# Patient Record
Sex: Male | Born: 1940 | Race: White | Hispanic: No | Marital: Married | Smoking: Never smoker
Health system: Southern US, Community
[De-identification: ages and names within clinical notes are randomized; demographics above are authoritative.]

## PROBLEM LIST (undated history)

## (undated) DIAGNOSIS — I10 Essential (primary) hypertension: Secondary | ICD-10-CM

## (undated) DIAGNOSIS — C61 Malignant neoplasm of prostate: Secondary | ICD-10-CM

## (undated) DIAGNOSIS — F419 Anxiety disorder, unspecified: Secondary | ICD-10-CM

## (undated) DIAGNOSIS — E785 Hyperlipidemia, unspecified: Secondary | ICD-10-CM

## (undated) DIAGNOSIS — C4490 Unspecified malignant neoplasm of skin, unspecified: Secondary | ICD-10-CM

## (undated) HISTORY — PX: POLYPECTOMY: SHX149

## (undated) HISTORY — PX: COLONOSCOPY: SHX174

## (undated) HISTORY — DX: Hyperlipidemia, unspecified: E78.5

## (undated) HISTORY — PX: CARDIAC CATHETERIZATION: SHX172

---

## 1968-09-25 HISTORY — PX: FINGER SURGERY: SHX640

## 1978-09-26 DIAGNOSIS — I10 Essential (primary) hypertension: Secondary | ICD-10-CM

## 1978-09-26 HISTORY — DX: Essential (primary) hypertension: I10

## 2002-12-06 ENCOUNTER — Ambulatory Visit (HOSPITAL_COMMUNITY): Admission: RE | Admit: 2002-12-06 | Discharge: 2002-12-06 | Payer: Self-pay | Admitting: Urology

## 2009-07-21 ENCOUNTER — Encounter (INDEPENDENT_AMBULATORY_CARE_PROVIDER_SITE_OTHER): Payer: Self-pay | Admitting: *Deleted

## 2009-08-11 ENCOUNTER — Encounter (INDEPENDENT_AMBULATORY_CARE_PROVIDER_SITE_OTHER): Payer: Self-pay | Admitting: *Deleted

## 2009-08-13 ENCOUNTER — Ambulatory Visit: Payer: Self-pay | Admitting: Gastroenterology

## 2009-08-27 ENCOUNTER — Ambulatory Visit: Payer: Self-pay | Admitting: Gastroenterology

## 2009-08-29 ENCOUNTER — Encounter: Payer: Self-pay | Admitting: Gastroenterology

## 2010-02-25 DIAGNOSIS — E781 Pure hyperglyceridemia: Secondary | ICD-10-CM | POA: Insufficient documentation

## 2010-02-26 NOTE — Letter (Signed)
Summary: [facility] Instructions  [facility]  [address] [place], [place] [address]   Phone: [phone]  Fax: [phone]       Alex Mcmahon    [birth year]    MRN: [id number]        Procedure Day Alex Mcmahon:  Wednesday 08/27/2009     Arrival Time: 9:00 am      Procedure Time: 10:00 am     Location of Procedure:                    _x _  [facility] (4th Floor)                        PREPARATION FOR COLONOSCOPY WITH MOVIPREP   Starting 5 days prior to your procedure Friday 7/29 do not eat nuts, seeds, popcorn, corn, beans, peas,  salads, or any raw vegetables.  Do not take any fiber supplements (e.g. Metamucil, Citrucel, and Benefiber).  THE DAY BEFORE YOUR PROCEDURE         DATE: Tuesday 8/2  1.  Drink clear liquids the entire day-NO SOLID FOOD  2.  Do not drink anything colored red or purple.  Avoid juices with pulp.  No orange juice.  3.  Drink at least 64 oz. (8 glasses) of fluid/clear liquids during the day to prevent dehydration and help the prep work efficiently.  CLEAR LIQUIDS INCLUDE: Water Jello Ice Popsicles Tea (sugar ok, no milk/cream) Powdered fruit flavored drinks Coffee (sugar ok, no milk/cream) Gatorade Juice: apple, white grape, white cranberry  Lemonade Clear bullion, consomm, broth Carbonated beverages (any kind) Strained chicken noodle soup Hard Candy                             4.  In the morning, mix first dose of MoviPrep solution:    Empty 1 Pouch A and 1 Pouch B into the disposable container    Add lukewarm drinking water to the top line of the container. Mix to dissolve    Refrigerate (mixed solution should be used within 24 hrs)  5.  Begin drinking the prep at 5:00 p.m. The MoviPrep container is divided by 4 marks.   Every 15 minutes drink the solution down to the next mark (approximately 8 oz) until the full liter is complete.   6.  Follow completed prep with 16 oz of clear liquid of your choice  (Nothing red or purple).  Continue to drink clear liquids until bedtime.  7.  Before going to bed, mix second dose of MoviPrep solution:    Empty 1 Pouch A and 1 Pouch B into the disposable container    Add lukewarm drinking water to the top line of the container. Mix to dissolve    Refrigerate  THE DAY OF YOUR PROCEDURE      DATE:  Wednesday 8/3  Beginning at 5:00 am (5 hours before procedure):         1. Every 15 minutes, drink the solution down to the next mark (approx 8 oz) until the full liter is complete.  2. Follow completed prep with 16 oz. of clear liquid of your choice.    3. You may drink clear liquids until 8:00 am (2 HOURS BEFORE PROCEDURE).   MEDICATION INSTRUCTIONS  Unless otherwise instructed, you should take regular prescription medications with a small sip of water   as early as possible the morning  of your procedure.           OTHER INSTRUCTIONS  You will need a responsible adult at least 68 years of age to accompany you and drive you home.   This person must remain in the waiting room during your procedure.  Wear loose fitting clothing that is easily removed.  Leave jewelry and other valuables at home.  However, you may wish to bring a book to read or  an iPod/MP3 player to listen to music as you wait for your procedure to start.  Remove all body piercing jewelry and leave at home.  Total time from sign-in until discharge is approximately 2-3 hours.  You should go home directly after your procedure and rest.  You can resume normal activities the  day after your procedure.  The day of your procedure you should not:   Drive   Make legal decisions   Operate machinery   Drink alcohol   Return to work  You will receive specific instructions about eating, activities and medications before you leave.    The above instructions have been reviewed and explained to me by   Ezra Sites RN  August 13, 2009 10:33 AM     I fully understand and  can verbalize these instructions _____________________________ Date _________

## 2010-02-26 NOTE — Letter (Signed)
Summary: Patient Notice- Polyp Results  [facility]  [address] [place], [place] [address]   Phone: [phone]  Fax: [phone]        August 29, 2009 MRN: [id number]    Alex Mcmahon [address] [place], [place]  [address]    Dear Alex Mcmahon,  I am pleased to inform you that the colon polyp(s) removed during your recent colonoscopy was (were) found to be benign (no cancer detected) upon pathologic examination.  I recommend you have a repeat colonoscopy examination in 5_ years to look for recurrent polyps, as having colon polyps increases your risk for having recurrent polyps or even colon cancer in the future.  Should you develop new or worsening symptoms of abdominal pain, bowel habit changes or bleeding from the rectum or bowels, please schedule an evaluation with either your primary care physician or with me.  Additional information/recommendations:  __ No further action with gastroenterology is needed at this time. Please      follow-up with your primary care physician for your other healthcare      needs.  __ Please call [phone] to schedule a return visit to review your      situation.  __ Please keep your follow-up visit as already scheduled.  _x_ Continue treatment plan as outlined the day of your exam.  Please call us if you are having persistent problems or have questions about your condition that have not been fully answered at this time.  Sincerely,  Louis Meckel MD  This letter has been electronically signed by your physician.  Appended Document: Patient Notice- Polyp Results letter mailed

## 2010-02-26 NOTE — Letter (Signed)
Summary: Previsit letter  [facility]  [address] [place], [place] [address]   Phone: [phone]  Fax: [phone]       07/21/2009 MRN: [id number]  Alex Mcmahon [address] [place], [place]  [address]  Dear Mr. Barstow,  Welcome to the Gastroenterology Division at [facility].    You are scheduled to see a nurse for your pre-procedure visit on 08-13-09 at 10am on the 3rd floor at [facility], [address]  We ask that you try to arrive at our office 15 minutes prior to your appointment time to allow for check-in.  Your nurse visit will consist of discussing your medical and surgical history, your immediate family medical history, and your medications.    Please bring a complete list of all your medications or, if you prefer, bring the medication bottles and we will list them.  We will need to be aware of both prescribed and over the counter drugs.  We will need to know exact dosage information as well.  If you are on blood thinners (Coumadin, Plavix, Aggrenox, Ticlid, etc.) please call our office today/prior to your appointment, as we need to consult with your physician about holding your medication.   Please be prepared to read and sign documents such as consent forms, a financial agreement, and acknowledgement forms.  If necessary, and with your consent, a friend or relative is welcome to sit-in on the nurse visit with you.  Please bring your insurance card so that we may make a copy of it.  If your insurance requires a referral to see a specialist, please bring your referral form from your primary care physician.  No co-pay is required for this nurse visit.     If you cannot keep your appointment, please call [phone] to cancel or reschedule prior to your appointment date.  This allows [place] the opportunity to schedule an appointment for another patient in need of care.    Thank you for choosing [facility] for your medical needs.  We  appreciate the opportunity to care for you.  Please visit [place] at our website  to learn more about our practice.                     Sincerely.                                                                                                                   The Gastroenterology Division

## 2010-02-26 NOTE — Procedures (Signed)
Summary: Colonoscopy  Patient: Alex Mcmahon Note: All result statuses are Final unless otherwise noted.  Tests: (1) Colonoscopy (COL)   COL Colonoscopy           DONE     [facility]     [address]     [place], [place]  [address]           COLONOSCOPY PROCEDURE REPORT           PATIENT:  Alex Mcmahon, Alex Mcmahon  MR#:  [id number]     BIRTHDATE:  03-10-1940, 68 yrs. old  GENDER:  male           ENDOSCOPIST:  Barbette Hair. Arlyce Dice, MD     Referred by:           PROCEDURE DATE:  08/27/2009     PROCEDURE:  Colonoscopy with snare polypectomy, Colon with cold     biopsy polypectomy     ASA CLASS:  Class II     INDICATIONS:           MEDICATIONS:   Fentanyl 50 mcg IV, Versed 5 mg IV           DESCRIPTION OF PROCEDURE:   After the risks benefits and     alternatives of the procedure were thoroughly explained, informed     consent was obtained.  Digital rectal exam was performed and     revealed no abnormalities.   The LB CF-[id number] endoscope     was introduced through the anus and advanced to the cecum, which     was identified by both the appendix and ileocecal valve, without     limitations.  The quality of the prep was excellent, using     MoviPrep.  The instrument was then slowly withdrawn as the colon     was fully examined.     <<PROCEDUREIMAGES>>           FINDINGS:  A sessile polyp was found in the cecum. It was 3 mm in     size. Polyp was snared without cautery. Retrieval was     unsuccessful. snare polyp  A sessile polyp was found in the     ascending colon. It was 2 mm in size. The polyp was removed using     cold biopsy forceps (see image2).  A sessile polyp was found in     the descending colon. It was 3 mm in size. Polyp was snared     without cautery. Retrieval was successful (see image4). snare     polyp  This was otherwise a normal examination of the colon (see     image1, image3, image8, and image9).   Retroflexed views in the     rectum revealed no  abnormalities.    The time to cecum =  4.0     minutes. The scope was then withdrawn (time =  9.0  min) from the     patient and the procedure completed.           COMPLICATIONS:  None           ENDOSCOPIC IMPRESSION:     1) 3 mm sessile polyp in the cecum     2) 2 mm sessile polyp in the ascending colon     3) 3 mm sessile polyp in the descending colon     4) Otherwise normal examination     RECOMMENDATIONS:     1) Colonoscopy 5 years  REPEAT EXAM:  In 5 year(s) for Colonoscopy.           ______________________________     Barbette Hair. Arlyce Dice, MD           CC: Tracey Harries, MD           n.     Rosalie Doctor:   Barbette Hair. Alithia Zavaleta at 08/27/2009 10:15 AM           Einar Gip, [id number]  Note: An exclamation mark (!) indicates a result that was not dispersed into the flowsheet. Document Creation Date: 08/27/2009 10:17 AM _______________________________________________________________________  (1) Order result status: Final Collection or observation date-time: 08/27/2009 10:07 Requested date-time:  Receipt date-time:  Reported date-time:  Referring Physician:   Ordering Physician: Melvia Heaps [phone]) Specimen Source:  Source: Launa Grill Order Number: [id number] Lab site:   Appended Document: Colonoscopy     Procedures Next Due Date:    Colonoscopy: 08/2014

## 2010-02-26 NOTE — Miscellaneous (Signed)
Summary: [facility]  Clinical Lists Changes  Medications: Added new medication of MOVIPREP 100 GM  SOLR (PEG-KCL-NACL-NASULF-NA ASC-C) As per prep instructions. - Signed Rx of MOVIPREP 100 GM  SOLR (PEG-KCL-NACL-NASULF-NA ASC-C) As per prep instructions.;  #1 x 0;  Signed;  Entered by: Ezra Sites RN;  Authorized by: Louis Meckel MD;  Method used: Electronically to [id number] [address], [place], [place], [place]  [address], Ph: [phone], Fax: [phone] Observations: Added new observation of NKA: T (08/13/2009 10:15)    Prescriptions: MOVIPREP 100 GM  SOLR (PEG-KCL-NACL-NASULF-NA ASC-C) As per prep instructions.  #1 x 0   Entered by:   Ezra Sites RN   Authorized by:   Louis Meckel MD   Signed by:   Ezra Sites RN on 08/13/2009   Method used:   Electronically to        [facility] [address] [id number] (retail)       [address]       [place], [place]  [address]       Ph: [phone]       Fax: [phone]   RxID:   [id number]

## 2011-02-17 DIAGNOSIS — M159 Polyosteoarthritis, unspecified: Secondary | ICD-10-CM | POA: Insufficient documentation

## 2011-06-01 ENCOUNTER — Other Ambulatory Visit: Payer: Self-pay | Admitting: Urology

## 2011-06-01 ENCOUNTER — Encounter (HOSPITAL_COMMUNITY): Payer: Self-pay | Admitting: *Deleted

## 2011-06-01 NOTE — Progress Notes (Signed)
Pt instructed to arrive 1000 to short stay with responsible driver, blue folder, and insurance card. NPO after midnight except for BP meds with small sip of water. No aspirin, melaxacam, or ibuprofen products until after procedure. Reviewed need for laxative the day before the procedure. Pt verbalizes understanding.

## 2011-06-02 ENCOUNTER — Encounter (HOSPITAL_COMMUNITY): Payer: Self-pay | Admitting: Pharmacy Technician

## 2011-06-02 NOTE — H&P (Signed)
roblems Problems  1. Ureteral Stone Right 592.1  History of Present Illness     Mr. Alex Mcmahon is a 71 yo WM former patient of Dr. Logan Bores with a history of stones who had the onset Friday of right flank pain.  The pain was severe last night and he went to his PCP who got a CT that showed a 4.5-32mm stone in the right ureter.  He is sent in consultation for the stone by Zoe Lan FNP.  He is pain free now.  He had no gross hematuria.  He had no voiding complaints.   He had ESWL in 2004.   Past Medical History Problems  1. History of  Arthritis V13.4 2. History of  Hyperlipidemia 272.4 3. History of  Hypertension 401.9 4. History of  Nephrolithiasis V13.01  Surgical History Problems  1. History of  Lithotripsy  Current Meds 1. AmLODIPine Besylate 10 MG Oral Tablet; Therapy: 03Feb2013 to 2. Aspirin 81 MG Oral Tablet; Therapy: 06May2013 to 3. CloNIDine HCl 0.2 MG Oral Tablet; Therapy: 03Mar2013 to 4. Losartan Potassium 100 MG Oral Tablet; Therapy: 24Mar2013 to 5. Meloxicam 15 MG Oral Tablet; Therapy: 13Mar2013 to  Allergies Medication  1. No Known Drug Allergies  Family History Problems  1. Family history of  Death In The Family Father 2. Family history of  Death In The Family Mother 3. Family history of  Family Health Status Number Of Children 4. Maternal aunt's history of  Nephrolithiasis  Social History Problems    Caffeine Use   Marital History - Currently Married   Never A Smoker   Occupation: Retired Denied    History of  Alcohol Use   History of  Tobacco Use 305.1  Review of Systems Genitourinary, constitutional, skin, eye, otolaryngeal, hematologic/lymphatic, cardiovascular, pulmonary, endocrine, musculoskeletal, gastrointestinal, neurological and psychiatric system(s) were reviewed and pertinent findings if present are noted.    Vitals Vital Signs [Data Includes: Last 1 Day]  06May2013 04:24PM  BMI Calculated: 28.57 BSA Calculated: 1.94 Height: 5 ft 7  in Weight: 182 lb  Blood Pressure: 131 / 85 Temperature: 98.3 F Heart Rate: 77  Physical Exam Constitutional: Well nourished and well developed . No acute distress.  ENT:. The ears and nose are normal in appearance.  Neck: The appearance of the neck is normal and no neck mass is present.  Pulmonary: No respiratory distress and normal respiratory rhythm and effort.  Cardiovascular: Heart rate and rhythm are normal . No peripheral edema.  Abdomen: The abdomen is soft and nontender. No masses are palpated. moderate right CVA tenderness. No hernias are palpable. No hepatosplenomegaly noted.  Skin: Normal skin turgor, no visible rash and no visible skin lesions.  Neuro/Psych:. Mood and affect are appropriate.    Results/Data Urine [Data Includes: Last 1 Day]   06May2013  COLOR YELLOW   APPEARANCE CLEAR   SPECIFIC GRAVITY 1.030   pH 5.0   GLUCOSE NEG mg/dL  BILIRUBIN NEG   KETONE NEG mg/dL  BLOOD MOD   PROTEIN TRACE mg/dL  UROBILINOGEN 0.2 mg/dL  NITRITE NEG   LEUKOCYTE ESTERASE NEG   SQUAMOUS EPITHELIAL/HPF FEW   WBC 0-3 WBC/hpf  RBC 0-3 RBC/hpf  BACTERIA RARE   CRYSTALS NONE SEEN   CASTS Hyaline casts noted   Other MUCUS PRESENT    Old records or history reviewed: I have reviewed records from Dr. Bonney Leitz office including labs, office notes and x-ray reports.  The following images/tracing/specimen were independently visualized:  I have reviewed his CT which showed  a 4.8mm right proximal stone with minimal hydro. He has a large gallstone as well. KUB today.  shows his stone is readily visible in the right proximal ureter. He has degenerative changes of the lumbar spine. No other significant abnormalities are noted.  Amended By: Bjorn Pippin; 05/31/2011 5:15 PMEST   Assessment Assessed  1. Ureteral Stone Right 592.1   He has a 4.5-56mm right proximal ureteral stone and is currently having minimal symptoms.   Plan Ureteral Stone (592.1)  1. Tamsulosin HCl 0.4 MG Oral Capsule;  TAKE 1 CAPSULE Daily with meals; Therapy: 06May2013  to (Evaluate:05Jul2013); Last Rx:06May2013 2. KUB  Done: 06May2013 12:00AM 3. Follow-up Schedule Surgery Office  Follow-up  Requested for: 06May2013   I discussed the options of medical therapy, ureteroscopy and ESWL and have reviewed the risks in detail.  I have recommended ESWL if he would like active treatment since the stone is proximal.  I reviewed the risks of bleeding, infection, failure of fragmentation with need for secondary procedures, injury to the kidney or adjacent structures, thrombotic events and sedation complications.

## 2011-06-02 NOTE — Progress Notes (Signed)
Called patient at home to notify him of time change to of ESWL to 0730. Requested that patient follow all previous instructions but he is to be at [facility] Stay at [facility] 06/03/11. Pt verbalized understanding of instructions

## 2011-06-03 ENCOUNTER — Encounter (HOSPITAL_COMMUNITY): Payer: Self-pay

## 2011-06-03 ENCOUNTER — Ambulatory Visit (HOSPITAL_COMMUNITY)
Admission: RE | Admit: 2011-06-03 | Discharge: 2011-06-03 | Disposition: A | Discharge status: Home / Self Care | Payer: Medicare Other | Source: Ambulatory Visit | Attending: Urology | Admitting: Urology

## 2011-06-03 ENCOUNTER — Encounter (HOSPITAL_COMMUNITY)
Admission: RE | Disposition: A | Discharge status: Home / Self Care | Payer: Self-pay | Source: Ambulatory Visit | Attending: Urology

## 2011-06-03 ENCOUNTER — Ambulatory Visit (HOSPITAL_COMMUNITY): Payer: Medicare Other

## 2011-06-03 DIAGNOSIS — Z79899 Other long term (current) drug therapy: Secondary | ICD-10-CM | POA: Insufficient documentation

## 2011-06-03 DIAGNOSIS — Z7982 Long term (current) use of aspirin: Secondary | ICD-10-CM | POA: Insufficient documentation

## 2011-06-03 DIAGNOSIS — N201 Calculus of ureter: Secondary | ICD-10-CM | POA: Insufficient documentation

## 2011-06-03 DIAGNOSIS — E785 Hyperlipidemia, unspecified: Secondary | ICD-10-CM | POA: Insufficient documentation

## 2011-06-03 DIAGNOSIS — I10 Essential (primary) hypertension: Secondary | ICD-10-CM | POA: Insufficient documentation

## 2011-06-03 HISTORY — DX: Essential (primary) hypertension: I10

## 2011-06-03 HISTORY — DX: Anxiety disorder, unspecified: F41.9

## 2011-06-03 SURGERY — LITHOTRIPSY, ESWL
Anesthesia: LOCAL | Laterality: Right

## 2011-06-03 MED ORDER — ACETAMINOPHEN 325 MG PO TABS: 650.0000 mg | ORAL_TABLET | ORAL | Status: DC | PRN

## 2011-06-03 MED ORDER — DIAZEPAM 5 MG PO TABS
10.0000 mg | ORAL_TABLET | ORAL | Status: AC
  Administered 2011-06-03: 10 mg via ORAL

## 2011-06-03 MED ORDER — SODIUM CHLORIDE 0.9 % IJ SOLN: 3.0000 mL | Freq: Two times a day (BID) | INTRAMUSCULAR | Status: DC

## 2011-06-03 MED ORDER — CIPROFLOXACIN HCL 500 MG PO TABS
500.0000 mg | ORAL_TABLET | ORAL | Status: AC
  Administered 2011-06-03: 500 mg via ORAL

## 2011-06-03 MED ORDER — OXYCODONE-ACETAMINOPHEN 5-325 MG PO TABS: 1.0000 | ORAL_TABLET | ORAL | Status: AC | PRN

## 2011-06-03 MED ORDER — DEXTROSE-NACL 5-0.45 % IV SOLN
INTRAVENOUS | Status: DC
  Administered 2011-06-03: 07:00:00 via INTRAVENOUS

## 2011-06-03 MED ORDER — ONDANSETRON HCL 4 MG/2ML IJ SOLN: 4.0000 mg | Freq: Four times a day (QID) | INTRAMUSCULAR | Status: DC | PRN

## 2011-06-03 MED ORDER — OXYCODONE HCL 5 MG PO TABS: 5.0000 mg | ORAL_TABLET | ORAL | Status: DC | PRN

## 2011-06-03 MED ORDER — DIPHENHYDRAMINE HCL 25 MG PO CAPS
25.0000 mg | ORAL_CAPSULE | ORAL | Status: AC
  Administered 2011-06-03: 25 mg via ORAL

## 2011-06-03 MED ORDER — DIAZEPAM 5 MG PO TABS
ORAL_TABLET | ORAL | Status: AC
  Administered 2011-06-03: 10 mg via ORAL
  Filled 2011-06-03: qty 2

## 2011-06-03 MED ORDER — SODIUM CHLORIDE 0.9 % IV SOLN: 250.0000 mL | INTRAVENOUS | Status: DC | PRN

## 2011-06-03 MED ORDER — ACETAMINOPHEN 650 MG RE SUPP
650.0000 mg | RECTAL | Status: DC | PRN
  Filled 2011-06-03: qty 1

## 2011-06-03 MED ORDER — DIPHENHYDRAMINE HCL 25 MG PO CAPS
ORAL_CAPSULE | ORAL | Status: AC
  Administered 2011-06-03: 25 mg via ORAL
  Filled 2011-06-03: qty 1

## 2011-06-03 MED ORDER — FENTANYL CITRATE 0.05 MG/ML IJ SOLN: 25.0000 ug | INTRAMUSCULAR | Status: DC | PRN

## 2011-06-03 MED ORDER — CIPROFLOXACIN HCL 500 MG PO TABS
ORAL_TABLET | ORAL | Status: AC
  Administered 2011-06-03: 500 mg via ORAL
  Filled 2011-06-03: qty 1

## 2011-06-03 MED ORDER — SODIUM CHLORIDE 0.9 % IJ SOLN: 3.0000 mL | INTRAMUSCULAR | Status: DC | PRN

## 2011-06-03 NOTE — Discharge Instructions (Signed)
Lithotripsy Care After Refer to this sheet for the next few weeks. These discharge instructions provide you with general information on caring for yourself after you leave the hospital. Your caregiver may also give you specific instructions. Your treatment has been planned according to the most current medical practices available, but unavoidable complications sometimes occur. If you have any problems or questions after discharge, please call your caregiver. AFTER THE PROCEDURE   The recovery time will vary with the procedure done.   You will be taken to the recovery area. A nurse will watch and check your progress. Once you are awake, stable, and taking fluids well, you will be allowed to go home as long as there are no problems.   Your urine may have a red tinge for a few days after treatment. Blood loss is usually minimal.   You may have soreness in the back or flank area. This usually goes away after a few days. The procedure can cause blotches or bruises on the back where the pressure wave enters the skin. These marks usually cause only minimal discomfort and should disappear in a short time.   Stone fragments should begin to pass within 24 hours of treatment. However, a delayed passage is not unusual.   You may have pain, discomfort, and feel sick to your stomach (nauseous) when the crushed (pulverized) fragments of stone are passed down the tube from the kidney to the bladder. Stone fragments can pass soon after the procedure and may last for up to 4 to 8 weeks.   A small number of patients may have severe pain when stone fragments are not able to pass, which leads to an obstruction.   If your stone is greater than 1 inch/2.5 centimeters in diameter or if you have multiple stones that have a combined diameter greater than 1 inch/2.5 centimeters, you may require more than 1 treatment.   You must have someone drive you home.  HOME CARE INSTRUCTIONS   Rest at home until you feel your  energy improving.   Only take over-the-counter or prescription medicines for pain, discomfort, or fever as directed by your caregiver. Depending on the type of lithotripsy, you may need to take medicines (antibiotics) that kill germs and anti-inflammatory medicines for a few days.   Drink enough water and fluids to keep your urine clear or pale yellow. This helps "flush" your kidneys. It helps pass any remaining pieces of stone and prevents stones from coming back.   Most people can resume daily activities within 1 or 2 days after standard lithotripsy. It can take longer to recover from laser and percutaneous lithotripsy.   If the stones are in your urinary system, you may be asked to strain your urine at home to look for stones. Any stones that are found can be sent to a medical lab for examination.   Visit your caregiver for a follow-up appointment in a few weeks. Your doctor may remove your stent if you have one. Your caregiver will also check to see whether stone particles still remain.  SEEK MEDICAL CARE IF:   You have an oral temperature above 102 F (38.9 C).   Your pain is not relieved by medicine.   You have a lasting nauseous feeling.   You feel there is too much blood in the urine.   You develop persistent problems with frequent and/or painful urination that does not at least partially improve after 2 days following the procedure.   You have a congested   cough.   You feel lightheaded.   You develop a rash or any other signs that might suggest an allergic problem.   You develop any reaction or side effects to your medicine(s).  SEEK IMMEDIATE MEDICAL CARE IF:   You experience severe back and/or flank pain.   You see nothing but blood when you urinate.   You cannot pass any urine at all.   You have an oral temperature above 102 F (38.9 C), not controlled by medicine.   You develop shortness of breath, difficulty breathing, or chest pain.   You develop vomiting  that will not stop after 6 to 8 hours.   You have a fainting episode.  MAKE SURE YOU:   Understand these instructions.   Will watch your condition.   Will get help right away if you are not doing well or get worse.  Document Released: 01/31/2007 Document Revised: 12/31/2010 Document Reviewed: 01/31/2007 Exit[employer] Patient Information 2012 [employer]. 

## 2011-06-03 NOTE — Interval H&P Note (Signed)
History and Physical Interval Note:  06/03/2011 7:57 AM  Alex Mcmahon  has presented today for surgery, with the diagnosis of right proximal stone   The various methods of treatment have been discussed with the patient and family. After consideration of risks, benefits and other options for treatment, the patient has consented to  Procedure(s) (LRB): EXTRACORPOREAL SHOCK WAVE LITHOTRIPSY (ESWL) (Right) as a surgical intervention .  The patients' history has been reviewed, patient examined, no change in status, stable for surgery.  I have reviewed the patients' chart and labs.  Questions were answered to the patient's satisfaction.     Wileen Duncanson J

## 2012-03-20 DIAGNOSIS — Z0000 Encounter for general adult medical examination without abnormal findings: Secondary | ICD-10-CM | POA: Insufficient documentation

## 2013-03-21 DIAGNOSIS — I10 Essential (primary) hypertension: Secondary | ICD-10-CM | POA: Insufficient documentation

## 2013-03-21 DIAGNOSIS — E782 Mixed hyperlipidemia: Secondary | ICD-10-CM | POA: Insufficient documentation

## 2013-08-27 ENCOUNTER — Encounter: Payer: Self-pay | Admitting: Gastroenterology

## 2014-04-11 ENCOUNTER — Other Ambulatory Visit: Payer: Self-pay | Admitting: Dermatology

## 2014-08-16 ENCOUNTER — Encounter: Payer: Self-pay | Admitting: Gastroenterology

## 2014-11-11 ENCOUNTER — Encounter: Payer: Self-pay | Admitting: Gastroenterology

## 2014-12-12 ENCOUNTER — Ambulatory Visit (AMBULATORY_SURGERY_CENTER): Payer: Self-pay | Admitting: *Deleted

## 2014-12-12 VITALS — Ht 68.0 in | Wt 186.4 lb

## 2014-12-12 DIAGNOSIS — Z86010 Personal history of colonic polyps: Secondary | ICD-10-CM

## 2014-12-12 MED ORDER — NA SULFATE-K SULFATE-MG SULF 17.5-3.13-1.6 GM/177ML PO SOLN: 1.0000 | Freq: Once | ORAL | Status: DC

## 2014-12-12 NOTE — Progress Notes (Signed)
No egg or soy allergy No issues with past sedation No diet pills No home 02 use     

## 2015-01-01 ENCOUNTER — Encounter: Payer: Medicare Other | Admitting: Gastroenterology

## 2015-02-24 ENCOUNTER — Other Ambulatory Visit: Payer: Self-pay | Admitting: *Deleted

## 2015-02-27 ENCOUNTER — Ambulatory Visit (AMBULATORY_SURGERY_CENTER): Payer: Medicare Other | Admitting: Gastroenterology

## 2015-02-27 ENCOUNTER — Encounter: Payer: Self-pay | Admitting: Gastroenterology

## 2015-02-27 VITALS — BP 121/71 | HR 58 | Temp 96.3°F | Resp 21 | Ht 68.0 in | Wt 186.0 lb

## 2015-02-27 DIAGNOSIS — D128 Benign neoplasm of rectum: Secondary | ICD-10-CM | POA: Diagnosis not present

## 2015-02-27 DIAGNOSIS — D124 Benign neoplasm of descending colon: Secondary | ICD-10-CM | POA: Diagnosis not present

## 2015-02-27 DIAGNOSIS — Z86010 Personal history of colonic polyps: Secondary | ICD-10-CM

## 2015-02-27 MED ORDER — SODIUM CHLORIDE 0.9 % IV SOLN
500.0000 mL | INTRAVENOUS | Status: DC
Start: 2015-02-27 — End: 2015-02-27

## 2015-02-27 NOTE — Progress Notes (Signed)
Called to room to assist during endoscopic procedure.  Patient ID and intended procedure confirmed with present staff. Received instructions for my participation in the procedure from the performing physician.  

## 2015-02-27 NOTE — Patient Instructions (Signed)
Discharge instructions given. Handouts on polyps and diverticulosis. Resume previous medications. YOU HAD AN ENDOSCOPIC PROCEDURE TODAY AT THE [facility]:   Refer to the procedure report that was given to you for any specific questions about what was found during the examination.  If the procedure report does not answer your questions, please call your gastroenterologist to clarify.  If you requested that your care partner not be given the details of your procedure findings, then the procedure report has been included in a sealed envelope for you to review at your convenience later.  YOU SHOULD EXPECT: Some feelings of bloating in the abdomen. Passage of more gas than usual.  Walking can help get rid of the air that was put into your GI tract during the procedure and reduce the bloating. If you had a lower endoscopy (such as a colonoscopy or flexible sigmoidoscopy) you may notice spotting of blood in your stool or on the toilet paper. If you underwent a bowel prep for your procedure, you may not have a normal bowel movement for a few days.  Please Note:  You might notice some irritation and congestion in your nose or some drainage.  This is from the oxygen used during your procedure.  There is no need for concern and it should clear up in a day or so.  SYMPTOMS TO REPORT IMMEDIATELY:   Following lower endoscopy (colonoscopy or flexible sigmoidoscopy):  Excessive amounts of blood in the stool  Significant tenderness or worsening of abdominal pains  Swelling of the abdomen that is new, acute  Fever of 100F or higher   For urgent or emergent issues, a gastroenterologist can be reached at any hour by calling [phone].   DIET: Your first meal following the procedure should be a small meal and then it is ok to progress to your normal diet. Heavy or fried foods are harder to digest and may make you feel nauseous or bloated.  Likewise, meals heavy in dairy and vegetables can  increase bloating.  Drink plenty of fluids but you should avoid alcoholic beverages for 24 hours.  ACTIVITY:  You should plan to take it easy for the rest of today and you should NOT DRIVE or use heavy machinery until tomorrow (because of the sedation medicines used during the test).    FOLLOW UP: Our staff will call the number listed on your records the next business day following your procedure to check on you and address any questions or concerns that you may have regarding the information given to you following your procedure. If we do not reach you, we will leave a message.  However, if you are feeling well and you are not experiencing any problems, there is no need to return our call.  We will assume that you have returned to your regular daily activities without incident.  If any biopsies were taken you will be contacted by phone or by letter within the next 1-3 weeks.  Please call us at [phone] if you have not heard about the biopsies in 3 weeks.    SIGNATURES/CONFIDENTIALITY: You and/or your care partner have signed paperwork which will be entered into your electronic medical record.  These signatures attest to the fact that that the information above on your After Visit Summary has been reviewed and is understood.  Full responsibility of the confidentiality of this discharge information lies with you and/or your care-partner. 

## 2015-02-27 NOTE — Progress Notes (Signed)
To recovery, report to McCoy, RN, VSS 

## 2015-02-27 NOTE — Op Note (Signed)
[place]  [address] [place], [address]   COLONOSCOPY PROCEDURE REPORT  PATIENT: Alex Mcmahon, Alex Mcmahon  MR#: [id number] BIRTHDATE: [birth year] , 52  yrs. old GENDER: male ENDOSCOPIST: Harl Bowie, MD REFERRED [id number] Ronnald Ramp, NP PROCEDURE DATE:  02/27/2015 PROCEDURE:   Colonoscopy, surveillance , Colonoscopy with cold biopsy polypectomy, and Colonoscopy with snare polypectomy First Screening Colonoscopy - Avg.  risk and is 50 yrs.  old or older - No.  Prior Negative Screening - Now for repeat screening. N/A  History of Adenoma - Now for follow-up colonoscopy & has been > or = to 3 yrs.  Yes hx of adenoma.  Has been 3 or more years since last colonoscopy.  Polyps removed today? Yes ASA CLASS:   Class II INDICATIONS:Surveillance due to prior colonic neoplasia and PH Colon Adenoma. MEDICATIONS: Propofol 200 mg IV  DESCRIPTION OF PROCEDURE:   After the risks benefits and alternatives of the procedure were thoroughly explained, informed consent was obtained.  The digital rectal exam revealed no abnormalities of the rectum.   The [id number]  endoscope was introduced through the anus and advanced to the cecum, which was identified by both the appendix and ileocecal valve. No adverse events experienced.   The quality of the prep was good.  The instrument was then slowly withdrawn as the colon was fully examined. Estimated blood loss is zero unless otherwise noted in this procedure report.   COLON FINDINGS: Two sessile polyps ranging between 3-75mm in size were found in the descending colon.  A polypectomy was performed with cold forceps.  The resection was complete, the polyp tissue was completely retrieved and sent to histology.   A sessile polyp ranging between 3-51mm in size was found in the rectum.  A polypectomy was performed with a cold snare.  The resection was complete, the polyp tissue was completely retrieved and sent to histology.   There was  mild diverticulosis noted in the sigmoid colon.  Retroflexed views revealed internal hemorrhoids. The time to cecum = 4.3 Withdrawal time = 16.4   The scope was withdrawn and the procedure completed. COMPLICATIONS: There were no immediate complications.  ENDOSCOPIC IMPRESSION: 1.   Two sessile polyps ranging between 3-75mm in size were found in the descending colon; polypectomy was performed with cold forceps 2.   Sessile polyp ranging between 3-9mm in size was found in the rectum; polypectomy was performed with a cold snare 3.   There was mild diverticulosis noted in the sigmoid colon  RECOMMENDATIONS: 1.  If the polyp(s) removed today are proven to be adenomatous (pre-cancerous) polyps, you will need a repeat colonoscopy in 5 years.  Otherwise you should continue to follow colorectal cancer screening guidelines for "routine risk" patients with colonoscopy in 10 years.  You will receive a letter within 1-2 weeks with the results of your biopsy as well as final recommendations.  Please call my office if you have not received a letter after 3 weeks. 2.  Await pathology results  eSigned:  Harl Bowie, MD 02/27/2015 11:11 AM   cc:

## 2015-02-28 ENCOUNTER — Telehealth: Payer: Self-pay | Admitting: *Deleted

## 2015-02-28 NOTE — Telephone Encounter (Signed)
  Follow up Call-  Call back number 02/27/2015  Post procedure Call Back phone  # [phone]  Permission to leave phone message Yes     Patient questions:  Do you have a fever, pain , or abdominal swelling? No. Pain Score  0 *  Have you tolerated food without any problems? Yes.    Have you been able to return to your normal activities? Yes.    Do you have any questions about your discharge instructions: Diet   No. Medications  No. Follow up visit  No.  Do you have questions or concerns about your Care? No.  Actions: * If pain score is 4 or above: No action needed, pain <4.

## 2015-03-09 ENCOUNTER — Encounter: Payer: Self-pay | Admitting: Gastroenterology

## 2015-03-09 DIAGNOSIS — M5440 Lumbago with sciatica, unspecified side: Secondary | ICD-10-CM | POA: Insufficient documentation

## 2015-03-25 ENCOUNTER — Other Ambulatory Visit: Payer: Self-pay | Admitting: Orthopedic Surgery

## 2015-03-25 DIAGNOSIS — M48061 Spinal stenosis, lumbar region without neurogenic claudication: Secondary | ICD-10-CM

## 2015-04-02 ENCOUNTER — Ambulatory Visit
Admission: RE | Admit: 2015-04-02 | Discharge: 2015-04-02 | Disposition: A | Discharge status: Home / Self Care | Payer: Medicare Other | Source: Ambulatory Visit | Attending: Orthopedic Surgery | Admitting: Orthopedic Surgery

## 2015-04-02 VITALS — BP 141/74 | HR 54

## 2015-04-02 DIAGNOSIS — M48061 Spinal stenosis, lumbar region without neurogenic claudication: Secondary | ICD-10-CM

## 2015-04-02 DIAGNOSIS — M5440 Lumbago with sciatica, unspecified side: Secondary | ICD-10-CM

## 2015-04-02 MED ORDER — IOHEXOL 180 MG/ML  SOLN
15.0000 mL | Freq: Once | INTRAMUSCULAR | Status: AC | PRN
  Administered 2015-04-02: 15 mL via INTRATHECAL

## 2015-04-02 MED ORDER — ONDANSETRON HCL 4 MG/2ML IJ SOLN
4.0000 mg | Freq: Once | INTRAMUSCULAR | Status: AC
  Administered 2015-04-02: 4 mg via INTRAMUSCULAR

## 2015-04-02 MED ORDER — DIAZEPAM 5 MG PO TABS
5.0000 mg | ORAL_TABLET | Freq: Once | ORAL | Status: AC
  Administered 2015-04-02: 5 mg via ORAL

## 2015-04-02 MED ORDER — MEPERIDINE HCL 100 MG/ML IJ SOLN
75.0000 mg | Freq: Once | INTRAMUSCULAR | Status: AC
  Administered 2015-04-02: 75 mg via INTRAMUSCULAR

## 2015-04-02 NOTE — Discharge Instructions (Signed)

## 2015-05-08 DIAGNOSIS — M48061 Spinal stenosis, lumbar region without neurogenic claudication: Secondary | ICD-10-CM | POA: Insufficient documentation

## 2016-06-28 DIAGNOSIS — Z125 Encounter for screening for malignant neoplasm of prostate: Secondary | ICD-10-CM | POA: Insufficient documentation

## 2017-06-27 DIAGNOSIS — C61 Malignant neoplasm of prostate: Secondary | ICD-10-CM | POA: Insufficient documentation

## 2017-11-28 ENCOUNTER — Other Ambulatory Visit: Payer: Self-pay | Admitting: Urology

## 2017-11-28 DIAGNOSIS — C61 Malignant neoplasm of prostate: Secondary | ICD-10-CM

## 2018-02-16 ENCOUNTER — Ambulatory Visit
Admission: RE | Admit: 2018-02-16 | Discharge: 2018-02-16 | Disposition: A | Discharge status: Home / Self Care | Payer: Medicare Other | Source: Ambulatory Visit | Attending: Urology | Admitting: Urology

## 2018-02-16 DIAGNOSIS — C61 Malignant neoplasm of prostate: Secondary | ICD-10-CM

## 2018-02-16 MED ORDER — GADOBENATE DIMEGLUMINE 529 MG/ML IV SOLN
17.0000 mL | Freq: Once | INTRAVENOUS | Status: AC | PRN
  Administered 2018-02-16: 17 mL via INTRAVENOUS

## 2019-01-17 DIAGNOSIS — G8929 Other chronic pain: Secondary | ICD-10-CM | POA: Insufficient documentation

## 2019-02-20 ENCOUNTER — Other Ambulatory Visit: Payer: Self-pay

## 2019-02-20 ENCOUNTER — Ambulatory Visit: Payer: Medicare Other | Attending: Neurosurgery | Admitting: Physical Therapy

## 2019-02-20 ENCOUNTER — Encounter: Payer: Self-pay | Admitting: Physical Therapy

## 2019-02-20 DIAGNOSIS — M5442 Lumbago with sciatica, left side: Secondary | ICD-10-CM | POA: Diagnosis present

## 2019-02-20 DIAGNOSIS — M5441 Lumbago with sciatica, right side: Secondary | ICD-10-CM | POA: Insufficient documentation

## 2019-02-20 DIAGNOSIS — R262 Difficulty in walking, not elsewhere classified: Secondary | ICD-10-CM | POA: Diagnosis present

## 2019-02-20 NOTE — Therapy (Signed)
[address] [place], [place], [address] Phone: [phone]   Fax:  [phone]  Physical Therapy Evaluation  Patient Details  Name: Alex Mcmahon MRN: [id number] Date of Birth: 1941/01/15 Referring Provider (PT): Toney Reil   Encounter Date: 02/20/2019  PT End of Session - 02/20/19 0824    Visit Number  1    Number of Visits  8    Date for PT Re-Evaluation  04/20/19    PT Start Time  0750    PT Stop Time  0825    PT Time Calculation (min)  35 min    Activity Tolerance  Patient tolerated treatment well    Behavior During Therapy  [facility] for tasks assessed/performed       Past Medical History:  Diagnosis Date  . Anxiety   . Hypertension 1980's    Past Surgical History:  Procedure Laterality Date  . CARDIAC CATHETERIZATION    . COLONOSCOPY    . FINGER SURGERY  1970's  . POLYPECTOMY      There were no vitals filed for this visit.   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  He reports that it has gotten worse over the past year, reports that his feet are numb on the bottoms.  He also reports pain and numbness in the thighs, reports DDD.    Limitations  Sitting;Lifting;Standing;Walking;House hold activities    How long can you stand comfortably?  15 minutes    How long can you walk comfortably?  reports he has to use an electric cart to do any shopping    Patient Stated Goals  have better strength, walk and stand, less pain    Currently in Pain?  Yes    Pain Score  1     Pain Location  Leg    Pain Orientation  Left;Right;Upper    Pain Descriptors / Indicators  Numbness    Pain Type  Acute pain    Pain Radiating Towards  into the thighs and the feet with any standing and walking    Pain Onset  More than a month ago    Pain Frequency  Intermittent    Aggravating Factors   standing  and walking will have numbness and fatigue in the thighs he does not really report pain just the weakness    Pain Relieving Factors  sitting will alleviate after a few minutes still numbness but less fatigue and weakness    Effect of Pain on Daily Activities  limited walking, standing         [facility] PT Assessment - 02/20/19 0001      Assessment   Medical Diagnosis  neurogenic claudication of the LE's    Referring Provider (PT)  Toney Reil    Onset Date/Surgical Date  02/06/19    Prior Therapy  no      Precautions   Precautions  None      Balance Screen   Has the patient fallen in the past 6 months  No    Has the patient had a decrease in activity level because of a fear of falling?   No    Is the patient reluctant to leave their home because of a fear of falling?   No      Home Environment   Additional Comments  does some yardwork and  some hosuework, no stairs      Prior Function   Level of Independence  Independent    Vocation  Retired    Writer    Leisure  no exercise      Posture/Postural Control   Posture Comments  decreased lordosis, appears to have either a lateral shift or scoliosis, his trunk leans significantly to the right, during lumbar flexion there was no rib hump so does not appear to be scoliosis, the left hip is higher in the standing, in supine the legs appear to be = length      ROM / Strength   AROM / PROM / Strength  AROM;Strength      AROM   Overall AROM Comments  lumbar flexion WFL's, extension decreased 50%, side bending to the right WFL's, to the left decreased 50%      Strength   Overall Strength Comments  with MMT he is 4+/5 for hips, ankles and knees, but his issue is standing and walking causes the legs to fatigue quickly      Flexibility   Soft Tissue Assessment /Muscle Length  yes    Hamstrings  good    Piriformis  good      Palpation   Palpation comment  lumbar mms are tight, no tnederness, has two  finger width diastisis recti      Ambulation/Gait   Gait Comments  significant right trunk lean, no device, 200 feet max and then needs to sit and rest due to fatigue in the thighs                Objective measurements completed on examination: See above findings.              PT Education - 02/20/19 0824    Education Details  HEP for Wms flexion exercises    Person(s) Educated  Patient    Methods  Explanation;Demonstration;Handout    Comprehension  Verbalized understanding;Returned demonstration;Verbal cues required       PT Short Term Goals - 02/20/19 0834      PT SHORT TERM GOAL #1   Title  independent with initial HEP    Time  1    Period  Weeks    Status  New        PT Long Term Goals - 02/20/19 [id number]      PT LONG TERM GOAL #1   Title  understand posture and body mechanics    Time  8    Period  Weeks    Status  New      PT LONG TERM GOAL #2   Title  increase walking distance to 500 feet    Time  8    Period  Weeks    Status  New      PT LONG TERM GOAL #3   Title  increase standing time to 20 minutes    Time  8    Period  Weeks    Status  New      PT LONG TERM GOAL #4   Title  independent with advanced HEP    Time  8    Period  Weeks    Status  New             Plan - 02/20/19 0825    Clinical Impression Statement  Patient reports that over the past year he has had increased difficulty with walking and standing, limited to 200 feet of walking and 10-15  mintues of standing, does not c/o much pain but reports severe fatigue in the thighs and numbness in the feet.  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  He had DDD show up in imaging.  HE has a significant right lateral shift but no rib hump with trunk flexion.  the left hip is higher in standing but when lying down the leg lengths are equal.  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    Stability/Clinical Decision  Making  Stable/Uncomplicated    Clinical Decision Making  Low    Rehab Potential  Good    PT Frequency  1x / week    PT Duration  8 weeks    PT Treatment/Interventions  ADLs/Self Care Home Management;Electrical Stimulation;Moist Heat;Traction;Therapeutic activities;Therapeutic exercise;Neuromuscular re-education;Manual techniques;Patient/family education    PT Next Visit Plan  will try some general strength and conditioning, could try traction    Consulted and Agree with Plan of Care  Patient       Patient will benefit from skilled therapeutic intervention in order to improve the following deficits and impairments:  Abnormal gait, Pain, Improper body mechanics, Increased muscle spasms, Postural dysfunction, Decreased strength, Decreased range of motion, Decreased activity tolerance, Decreased endurance, Difficulty walking  Visit Diagnosis: Difficulty in walking, not elsewhere classified - Plan: PT plan of care cert/re-cert  Acute bilateral low back pain with bilateral sciatica - Plan: PT plan of care cert/re-cert     Problem List Patient Active Problem List   Diagnosis Date Noted  . Acute back pain with sciatica 03/09/2015  . Benign hypertension 03/21/2013  . Combined fat and carbohydrate induced hyperlipemia 03/21/2013  . Cataract 03/20/2012  . Encounter for general adult medical examination without abnormal findings 03/20/2012  . Generalized degenerative joint disease of hand 02/17/2011  . Hypertriglyceridemia 02/25/2010  . H/O renal calculi 01/04/2008  . History of colon polyps 08/23/2007    Sumner Boast., PT 02/20/2019, 8:38 AM  [address] [place], [place], [address] Phone: [phone]   Fax:  [phone]  Name: Alex Mcmahon MRN: [id number] Date of Birth: 1940/10/25

## 2019-02-27 ENCOUNTER — Ambulatory Visit: Payer: Medicare Other | Admitting: Physical Therapy

## 2019-05-21 ENCOUNTER — Ambulatory Visit (INDEPENDENT_AMBULATORY_CARE_PROVIDER_SITE_OTHER): Payer: Medicare Other | Admitting: Cardiology

## 2019-05-21 ENCOUNTER — Encounter: Payer: Self-pay | Admitting: Cardiology

## 2019-05-21 ENCOUNTER — Other Ambulatory Visit: Payer: Self-pay

## 2019-05-21 VITALS — BP 142/78 | HR 80 | Ht 69.0 in | Wt 178.0 lb

## 2019-05-21 DIAGNOSIS — I10 Essential (primary) hypertension: Secondary | ICD-10-CM

## 2019-05-21 DIAGNOSIS — R072 Precordial pain: Secondary | ICD-10-CM | POA: Diagnosis not present

## 2019-05-21 DIAGNOSIS — E7800 Pure hypercholesterolemia, unspecified: Secondary | ICD-10-CM

## 2019-05-21 MED ORDER — METOPROLOL TARTRATE 100 MG PO TABS
ORAL_TABLET | ORAL | 0 refills | Status: DC
Start: 2019-05-21 — End: 2021-06-17

## 2019-05-21 NOTE — Patient Instructions (Signed)
Medication Instructions:  NO CHANGE *If you need a refill on your cardiac medications before your next appointment, please call your pharmacy*   Lab Work: If you have labs (blood work) drawn today and your tests are completely normal, you will receive your results only by: Marland Kitchen MyChart Message (if you have MyChart) OR . A paper copy in the mail If you have any lab test that is abnormal or we need to change your treatment, we will call you to review the results.   Testing/Procedures: Your cardiac CT will be scheduled at one of the below locations:   [facility] [address] [place], [place] [address] [phone]  If scheduled at [facility], please arrive at the [facility] main entrance of [facility] 30 minutes prior to test start time. Proceed to the [facility] Radiology Department (first floor) to check-in and test prep.  Please follow these instructions carefully (unless otherwise directed):  Hold all erectile dysfunction medications at least 3 days (72 hrs) prior to test.  On the Night Before the Test: . Be sure to Drink plenty of water. . Do not consume any caffeinated/decaffeinated beverages or chocolate 12 hours prior to your test. . Do not take any antihistamines 12 hours prior to your test. . If you take Metformin do not take 24 hours prior to test.  On the Day of the Test: . Drink plenty of water. Do not drink any water within one hour of the test. . Do not eat any food 4 hours prior to the test. . You may take your regular medications prior to the test.  . Take metoprolol (Lopressor) 100 MG two hours prior to test. . HOLD Furosemide/Hydrochlorothiazide morning of the test. . FEMALES- please wear underwire-free bra if available      After the Test: . Drink plenty of water. . After receiving IV contrast, you may experience a mild flushed feeling. This is normal. . On occasion, you may experience a mild rash up to 24 hours after the  test. This is not dangerous. If this occurs, you can take Benadryl 25 mg and increase your fluid intake. . If you experience trouble breathing, this can be serious. If it is severe call 911 IMMEDIATELY. If it is mild, please call our office. . If you take any of these medications: Glipizide/Metformin, Avandament, Glucavance, please do not take 48 hours after completing test unless otherwise instructed.   Once we have confirmed authorization from your insurance company, we will call you to set up a date and time for your test.   For non-scheduling related questions, please contact the cardiac imaging nurse navigator should you have any questions/concerns: Marchia Bond, RN Navigator Cardiac Imaging [facility] [phone] office  For scheduling needs, including cancellations and rescheduling, please call [phone].      Follow-Up: At [facility], you and your health needs are our priority.  As part of our continuing mission to provide you with exceptional heart care, we have created designated Provider Care Teams.  These Care Teams include your primary Cardiologist (physician) and Advanced Practice Providers (APPs -  Physician Assistants and Nurse Practitioners) who all work together to provide you with the care you need, when you need it.  We recommend signing up for the patient portal called "MyChart".  Sign up information is provided on this After Visit Summary.  MyChart is used to connect with patients for Virtual Visits (Telemedicine).  Patients are able to  view lab/test results, encounter notes, upcoming appointments, etc.  Non-urgent messages can be sent to your provider as well.   To learn more about what you can do with MyChart, go to [email or web address].    Your next appointment:   12 month(s)  The format for your next appointment:   Either In Person or Virtual  Provider:   You may see Kirk Ruths MD or one of the following Advanced  Practice Providers on your designated Care Team:    Kerin Ransom, PA-C  [place], [place]  Coletta Memos, [place]

## 2019-05-21 NOTE — Progress Notes (Signed)
Jeanmarie Plant, NP Reason for referral-chest pain  HPI: 79 year old male for evaluation of chest pain and bradycardia at request of Eldridge Abrahams, NP.  Seen recently by primary care and noted chest pain.  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  I do not have those results available.  Note symptoms apparently occurred after eating barbecue.  Laboratories show hemoglobin 14.2, creatinine 1.47, SGOT 73, SGPT 98, bilirubin 2.2 and normal troponin.  Patient does not have dyspnea on exertion, orthopnea, PND, pedal edema, exertional chest pain or syncope.  Prior to his recent episode he had eaten barbecue.  He then developed a burning sensation in his chest without radiation.  No associated symptoms.  He states he felt he may be having a heart attack.  His symptoms lasted 2 hours and resolved.  He has had no symptoms since.  Current Outpatient Medications  Medication Sig Dispense Refill  . amLODipine (NORVASC) 10 MG tablet Take 10 mg by mouth daily with breakfast.     . aspirin 81 MG tablet Take 81 mg by mouth daily with breakfast.     . cloNIDine (CATAPRES) 0.2 MG tablet Take 0.2 mg by mouth 2 (two) times daily. Reported on 02/27/2015    . glucosamine-chondroitin 500-400 MG tablet Take 2 tablets by mouth daily.    . meloxicam (MOBIC) 15 MG tablet Take 15 mg by mouth daily with breakfast.     . metoprolol tartrate (LOPRESSOR) 25 MG tablet Take 25 mg by mouth.    . Multiple Vitamin (MULTIVITAMIN) tablet Take 1 tablet by mouth daily. One a day men's vitamin    . omega-3 acid ethyl esters (LOVAZA) 1 G capsule Take 2 g by mouth 2 (two) times daily.    Marland Kitchen losartan-hydrochlorothiazide (HYZAAR) 100-25 MG tablet Take by mouth.     No current facility-administered medications for this visit.    Allergies  Allergen Reactions  . Lisinopril Other (See Comments)    Over-sedated patient     Past Medical History:  Diagnosis Date  . Anxiety   . Hyperlipidemia    . Hypertension 1980's    Past Surgical History:  Procedure Laterality Date  . CARDIAC CATHETERIZATION    . COLONOSCOPY    . FINGER SURGERY  1970's  . POLYPECTOMY      Social History   Socioeconomic History  . Marital status: Married    Spouse name: Not on file  . Number of children: 1  . Years of education: Not on file  . Highest education level: Not on file  Occupational History  . Not on file  Tobacco Use  . Smoking status: Never Smoker  . Smokeless tobacco: Never Used  Substance and Sexual Activity  . Alcohol use: No    Alcohol/week: 0.0 standard drinks  . Drug use: No  . Sexual activity: Not on file  Other Topics Concern  . Not on file  Social History Narrative  . Not on file   Social Determinants of Health   Financial Resource Strain:   . Difficulty of Paying Living Expenses:   Food Insecurity:   . Worried About Charity fundraiser in the Last Year:   . Arboriculturist in the Last Year:   Transportation Needs:   . Film/video editor (Medical):   Marland Kitchen Lack of Transportation (Non-Medical):   Physical Activity:   . Days of Exercise per Week:   . Minutes of Exercise per Session:   Stress:   .  Feeling of Stress :   Social Connections:   . Frequency of Communication with Friends and Family:   . Frequency of Social Gatherings with Friends and Family:   . Attends Religious Services:   . Active Member of Clubs or Organizations:   . Attends Archivist Meetings:   Marland Kitchen Marital Status:   Intimate Partner Violence:   . Fear of Current or Ex-Partner:   . Emotionally Abused:   Marland Kitchen Physically Abused:   . Sexually Abused:     Family History  Problem Relation Age of Onset  . Heart disease Mother   . Heart disease Father   . Colon cancer Neg Hx   . Colon polyps Neg Hx   . Rectal cancer Neg Hx   . Stomach cancer Neg Hx     ROS: no fevers or chills, productive cough, hemoptysis, dysphasia, odynophagia, melena, hematochezia, dysuria, hematuria, rash,  seizure activity, orthopnea, PND, pedal edema, claudication. Remaining systems are negative.  Physical Exam:   Blood pressure (!) 142/78, pulse 80, height 5\' 9"  (1.753 m), weight 178 lb (80.7 kg), SpO2 98 %.  General:  Well developed/well nourished in NAD Skin warm/dry Patient not depressed No peripheral clubbing Back-normal HEENT-normal/normal eyelids Neck supple/normal carotid upstroke bilaterally; no bruits; no JVD; no thyromegaly chest - CTA/ normal expansion CV - RRR/normal S1 and S2; no murmurs, rubs or gallops;  PMI nondisplaced Abdomen -NT/ND, no right upper quadrant tenderness, no HSM, no mass, + bowel sounds, no bruit 2+ femoral pulses, no bruits Ext-no edema, chords, 2+ DP Neuro-grossly nonfocal  ECG -electrocardiogram shows sinus rhythm, left anterior fascicular block.  Personally reviewed  A/P  1 chest pain-symptoms possibly secondary to reflux.  However he has a strong family history of coronary disease, hypertension and hyperlipidemia.  I will arrange a cardiac CTA to fully assess.  2 hypertension-patient's blood pressure is controlled.  Continue present medications.  3 hyperlipidemia-Per primary care.  4 elevated liver functions-patient will need follow-up with primary care and I discussed this with him.  Patient has no right upper quadrant tenderness on examination.  Kirk Ruths, MD

## 2019-06-01 ENCOUNTER — Other Ambulatory Visit: Payer: Self-pay | Admitting: *Deleted

## 2019-06-01 DIAGNOSIS — R072 Precordial pain: Secondary | ICD-10-CM

## 2019-06-05 LAB — BASIC METABOLIC PANEL
BUN/Creatinine Ratio: 16 (ref 10–24)
BUN: 21 mg/dL (ref 8–27)
CO2: 22 mmol/L (ref 20–29)
Calcium: 9 mg/dL (ref 8.6–10.2)
Chloride: 103 mmol/L (ref 96–106)
Creatinine, Ser: 1.34 mg/dL — ABNORMAL HIGH (ref 0.76–1.27)
GFR calc Af Amer: 59 mL/min/{1.73_m2} — ABNORMAL LOW (ref >59)
GFR calc non Af Amer: 51 mL/min/{1.73_m2} — ABNORMAL LOW (ref >59)
Glucose: 94 mg/dL (ref 65–99)
Potassium: 3.7 mmol/L (ref 3.5–5.2)
Sodium: 140 mmol/L (ref 134–144)

## 2019-06-08 ENCOUNTER — Encounter: Payer: Self-pay | Admitting: *Deleted

## 2019-06-11 ENCOUNTER — Telehealth (HOSPITAL_COMMUNITY): Payer: Self-pay | Admitting: *Deleted

## 2019-06-11 NOTE — Telephone Encounter (Signed)
Reaching out to patient to offer assistance regarding upcoming cardiac imaging study. At the pt's request, pt's wife contacted.  Pt's wife verbalizes understanding of appt date/time, parking situation and where to check in, pre-test NPO status and medications ordered, and verified current allergies; name and call back number provided for further questions should they arise  Alex Saver RN Navigator Cardiac Imaging [facility] [phone] office [phone] cell

## 2019-06-12 ENCOUNTER — Encounter (HOSPITAL_COMMUNITY): Payer: Self-pay

## 2019-06-12 DIAGNOSIS — R911 Solitary pulmonary nodule: Secondary | ICD-10-CM | POA: Diagnosis not present

## 2019-06-12 DIAGNOSIS — I2510 Atherosclerotic heart disease of native coronary artery without angina pectoris: Secondary | ICD-10-CM | POA: Insufficient documentation

## 2019-06-12 DIAGNOSIS — I700 Atherosclerosis of aorta: Secondary | ICD-10-CM | POA: Diagnosis not present

## 2019-06-12 DIAGNOSIS — R072 Precordial pain: Secondary | ICD-10-CM

## 2019-06-12 MED ORDER — NITROGLYCERIN 0.4 MG SL SUBL
0.8000 mg | SUBLINGUAL_TABLET | Freq: Once | SUBLINGUAL | Status: AC
  Administered 2019-06-12: 0.8 mg via SUBLINGUAL

## 2019-06-12 MED ORDER — NITROGLYCERIN 0.4 MG SL SUBL
SUBLINGUAL_TABLET | SUBLINGUAL | Status: AC
  Filled 2019-06-12: qty 2

## 2019-06-12 MED ORDER — IOHEXOL 350 MG/ML SOLN
80.0000 mL | Freq: Once | INTRAVENOUS | Status: AC | PRN
  Administered 2019-06-12: 80 mL via INTRAVENOUS

## 2019-06-13 ENCOUNTER — Ambulatory Visit (HOSPITAL_COMMUNITY)
Admission: RE | Admit: 2019-06-13 | Discharge: 2019-06-13 | Disposition: A | Discharge status: Home / Self Care | Payer: Medicare Other | Source: Ambulatory Visit | Attending: Cardiology | Admitting: Cardiology

## 2019-06-13 DIAGNOSIS — R072 Precordial pain: Secondary | ICD-10-CM

## 2019-06-14 DIAGNOSIS — Z6826 Body mass index (BMI) 26.0-26.9, adult: Secondary | ICD-10-CM

## 2019-06-14 DIAGNOSIS — R072 Precordial pain: Secondary | ICD-10-CM | POA: Diagnosis not present

## 2019-06-14 DIAGNOSIS — I2510 Atherosclerotic heart disease of native coronary artery without angina pectoris: Secondary | ICD-10-CM | POA: Diagnosis not present

## 2019-12-03 ENCOUNTER — Encounter: Payer: Self-pay | Admitting: Radiation Oncology

## 2019-12-03 NOTE — Progress Notes (Signed)
GU Location of Tumor / Histology: prostatic adenocarcinoma  If Prostate Cancer, Gleason Score is (3 + 4) and PSA is (6.94). 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  Biopsies of prostate (if applicable) revealed:   Past/Anticipated interventions by urology, if any: prostate biopsy, active surveillance, prostate biopsy, referral to Dr. Tammi Klippel for consideration of radiation therapy  Past/Anticipated interventions by medical oncology, if any: no  Weight changes, if any: denies  Bowel/Bladder complaints, if any: IPSS 0. SHIM 1. Reports he isn't sexually active. Denies dysuria, hematuria, or urinary leakage. Denies any bowel complaints.    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  SAFETY ISSUES:  Prior radiation? denies  Pacemaker/ICD? denies  Possible current pregnancy? no, male patient  Is the patient on methotrexate? denies  Current Complaints / other details:  79 year old male. Resides in [place]. Married with 1 daughter. Retired.

## 2019-12-04 ENCOUNTER — Ambulatory Visit
Admission: RE | Admit: 2019-12-04 | Discharge: 2019-12-04 | Disposition: A | Discharge status: Home / Self Care | Payer: Medicare Other | Source: Ambulatory Visit | Attending: Radiation Oncology | Admitting: Radiation Oncology

## 2019-12-04 ENCOUNTER — Encounter: Payer: Self-pay | Admitting: Radiation Oncology

## 2019-12-04 ENCOUNTER — Other Ambulatory Visit: Payer: Self-pay

## 2019-12-04 ENCOUNTER — Encounter: Payer: Self-pay | Admitting: Medical Oncology

## 2019-12-04 VITALS — BP 155/80 | HR 79 | Temp 97.8°F | Resp 18 | Ht 68.0 in | Wt 177.6 lb

## 2019-12-04 DIAGNOSIS — I10 Essential (primary) hypertension: Secondary | ICD-10-CM | POA: Diagnosis not present

## 2019-12-04 DIAGNOSIS — F419 Anxiety disorder, unspecified: Secondary | ICD-10-CM | POA: Insufficient documentation

## 2019-12-04 DIAGNOSIS — Z7982 Long term (current) use of aspirin: Secondary | ICD-10-CM | POA: Insufficient documentation

## 2019-12-04 DIAGNOSIS — E785 Hyperlipidemia, unspecified: Secondary | ICD-10-CM | POA: Insufficient documentation

## 2019-12-04 DIAGNOSIS — Z79899 Other long term (current) drug therapy: Secondary | ICD-10-CM | POA: Diagnosis not present

## 2019-12-04 DIAGNOSIS — C61 Malignant neoplasm of prostate: Secondary | ICD-10-CM

## 2019-12-04 HISTORY — DX: Malignant neoplasm of prostate: C61

## 2019-12-04 HISTORY — DX: Unspecified malignant neoplasm of skin, unspecified: C44.90

## 2019-12-04 NOTE — Progress Notes (Signed)
Radiation Oncology         [phone] ________________________________  Initial outpatient Consultation  Name: Alex Mcmahon MRN: [id number]  Date: 12/04/2019  DOB: 04-Jul-1940  JH:ERDEY, Sherrill Raring, NP  Irine Seal, MD   REFERRING PHYSICIAN: Irine Seal, MD  DIAGNOSIS: 79 y.o. gentleman with Stage T1c adenocarcinoma of the prostate with Gleason score of 3+4, and PSA of 6.94.    ICD-10-CM   1. Cancer of prostate [facility])  C61     HISTORY OF PRESENT ILLNESS: Alex Mcmahon is a 79 y.o. male with a diagnosis of prostate cancer. He was initially diagnosed with Gleason 3+3 disease in 3/12 cores in 01/2017 by Dr. Jeffie Pollock, with a PSA of 5.05. They appropriately opted for active surveillance. His PSA dropped below 5 on subsequent readings. He underwent surveillance prostate MRI on 02/16/2018 showing no findings to strongly suggest high-grade or macroscopic carcinoma; two areas of signal abnormality within peripheral zone, consistent with PI-RADS 2 lesions; no pelvic adenopathy.  His PSA rose to 5.58 in 07/2018 and to 5.92 in 01/2019. The patient proceeded to surveillance biopsy on 05/21/2019.  The prostate volume measured 90 cc.  Out of 12 core biopsies, 4 were positive, all with 10% or less of disease in each.  The maximum Gleason score was 3+4, and this was seen in right base lateral.  Additionally, Gleason 3+3 was seen in left base, left base lateral, and left mid lateral. He opted for continued active surveillance, but more recently his PSA showed further elevation to 6.94 on 11/20/2019.  The patient reviewed the biopsy results with his urologist and he has kindly been referred today for discussion of potential radiation treatment options.   PREVIOUS RADIATION THERAPY: No  PAST MEDICAL HISTORY:  Past Medical History:  Diagnosis Date  . Anxiety   . Hyperlipidemia   . Hypertension 1980's  . Prostate cancer ([place])       PAST SURGICAL HISTORY: Past Surgical History:  Procedure Laterality  Date  . CARDIAC CATHETERIZATION    . COLONOSCOPY    . FINGER SURGERY  1970's  . POLYPECTOMY      FAMILY HISTORY:  Family History  Problem Relation Age of Onset  . Heart disease Mother   . Heart disease Father   . Colon cancer Neg Hx   . Colon polyps Neg Hx   . Rectal cancer Neg Hx   . Stomach cancer Neg Hx   . Prostate cancer Neg Hx   . Breast cancer Neg Hx     SOCIAL HISTORY:  Social History   Socioeconomic History  . Marital status: Married    Spouse name: Not on file  . Number of children: 1  . Years of education: Not on file  . Highest education level: Not on file  Occupational History  . Not on file  Tobacco Use  . Smoking status: Never Smoker  . Smokeless tobacco: Never Used  Vaping Use  . Vaping Use: Never used  Substance and Sexual Activity  . Alcohol use: No    Alcohol/week: 0.0 standard drinks  . Drug use: No  . Sexual activity: Yes  Other Topics Concern  . Not on file  Social History Narrative  . Not on file   Social Determinants of Health   Financial Resource Strain:   . Difficulty of Paying Living Expenses: Not on file  Food Insecurity:   . Worried About Charity fundraiser in the Last Year: Not on file  . Ran Out of  Food in the Last Year: Not on file  Transportation Needs:   . Lack of Transportation (Medical): Not on file  . Lack of Transportation (Non-Medical): Not on file  Physical Activity:   . Days of Exercise per Week: Not on file  . Minutes of Exercise per Session: Not on file  Stress:   . Feeling of Stress : Not on file  Social Connections:   . Frequency of Communication with Friends and Family: Not on file  . Frequency of Social Gatherings with Friends and Family: Not on file  . Attends Religious Services: Not on file  . Active Member of Clubs or Organizations: Not on file  . Attends Archivist Meetings: Not on file  . Marital Status: Not on file  Intimate Partner Violence:   . Fear of Current or Ex-Partner: Not on  file  . Emotionally Abused: Not on file  . Physically Abused: Not on file  . Sexually Abused: Not on file    ALLERGIES: Lisinopril and Docosahexaenoic acid (dha)  MEDICATIONS:  Current Outpatient Medications  Medication Sig Dispense Refill  . diclofenac Sodium (VOLTAREN) 1 % GEL Use 4 grams to affected knee every 8 hours if needed    . fluticasone (FLONASE) 50 MCG/ACT nasal spray 2 sprays by Each Nare route daily.    Marland Kitchen gabapentin (NEURONTIN) 300 MG capsule Take one at HS x one week, then one po bid    . hydrochlorothiazide (HYDRODIURIL) 25 MG tablet Take 1 tablet by mouth daily.    Marland Kitchen olmesartan (BENICAR) 40 MG tablet Take 1 tablet by mouth daily.    . rosuvastatin (CRESTOR) 5 MG tablet Take by mouth.    Marland Kitchen amLODipine (NORVASC) 10 MG tablet Take 10 mg by mouth daily with breakfast.     . aspirin 81 MG tablet Take 81 mg by mouth daily with breakfast.     . cloNIDine (CATAPRES) 0.2 MG tablet Take 0.2 mg by mouth 2 (two) times daily. Reported on 02/27/2015    . glucosamine-chondroitin 500-400 MG tablet Take 2 tablets by mouth daily.    Marland Kitchen losartan-hydrochlorothiazide (HYZAAR) 100-25 MG tablet Take by mouth.    . meloxicam (MOBIC) 15 MG tablet Take 15 mg by mouth daily with breakfast.     . metoprolol tartrate (LOPRESSOR) 100 MG tablet TAKE 2 HOURS PRIOR TO CT SCAN 1 tablet 0  . metoprolol tartrate (LOPRESSOR) 25 MG tablet Take 25 mg by mouth.    . Multiple Vitamin (MULTIVITAMIN) tablet Take 1 tablet by mouth daily. One a day men's vitamin    . omega-3 acid ethyl esters (LOVAZA) 1 G capsule Take 2 g by mouth 2 (two) times daily.     No current facility-administered medications for this encounter.    REVIEW OF SYSTEMS:  On review of systems, the patient reports that he is doing well overall. He denies any chest pain, shortness of breath, cough, fevers, chills, night sweats, unintended weight changes. He denies any bowel disturbances, and denies abdominal pain, nausea or vomiting. He denies any  new musculoskeletal or joint aches or pains. His IPSS was 0, indicating no urinary symptoms. His SHIM was 1, although he notes he isn't sexually active. A complete review of systems is obtained and is otherwise negative.    PHYSICAL EXAM:  Wt Readings from Last 3 Encounters:  05/21/19 178 lb (80.7 kg)  02/27/15 186 lb (84.4 kg)  12/12/14 186 lb 6.4 oz (84.6 kg)   Temp Readings from Last 3 Encounters:  02/27/15 (!) 96.3 F (35.7 C) (Tympanic)  06/03/11 97.8 F (36.6 C) (Oral)   BP Readings from Last 3 Encounters:  06/12/19 108/65  05/21/19 (!) 142/78  04/02/15 (P) 113/65   Pulse Readings from Last 3 Encounters:  05/21/19 80  04/02/15 (!) (P) 58  02/27/15 (!) 58    /10  In general this is a well appearing man  in no acute distress. He's alert and oriented x4 and appropriate throughout the examination. Cardiopulmonary assessment is negative for acute distress and he exhibits normal effort.    KPS = 100  100 - Normal; no complaints; no evidence of disease. 90   - Able to carry on normal activity; minor signs or symptoms of disease. 80   - Normal activity with effort; some signs or symptoms of disease. 22   - Cares for self; unable to carry on normal activity or to do active work. 60   - Requires occasional assistance, but is able to care for most of his personal needs. 50   - Requires considerable assistance and frequent medical care. 78   - Disabled; requires special care and assistance. 72   - Severely disabled; hospital admission is indicated although death not imminent. 18   - Very sick; hospital admission necessary; active supportive treatment necessary. 10   - Moribund; fatal processes progressing rapidly. 0     - Dead  Karnofsky DA, Abelmann Cannon, Craver LS and [facility] [id number]) The use of the nitrogen mustards in the palliative treatment of carcinoma: with particular reference to bronchogenic carcinoma Cancer 1 634-56  LABORATORY DATA:  No results found for: WBC,  HGB, HCT, MCV, PLT Lab Results  Component Value Date   NA 140 06/05/2019   K 3.7 06/05/2019   CL 103 06/05/2019   CO2 22 06/05/2019   No results found for: ALT, AST, GGT, ALKPHOS, BILITOT   RADIOGRAPHY: No results found.    IMPRESSION/PLAN: 1. 79 y.o. gentleman with Stage T1c adenocarcinoma of the prostate with Gleason Score of 3+4, and PSA of 6.94. We discussed the patient's workup and outlined the nature of prostate cancer in this setting. The patient's T stage, Gleason's score, and PSA put him into the favorable intermediate risk group. Accordingly, he is eligible for a variety of potential treatment options including continued active surveillance, brachytherapy, 5.5 weeks of external radiation, or prostatectomy. We discussed the available radiation techniques, and focused on the details and logistics of delivery. The patient may not be an ideal candidate for brachytherapy with a prostate volume of 90 cc. We discussed and outlined the risks, benefits, short and long-term effects associated with radiotherapy and compared and contrasted these with prostatectomy. We discussed the role of SpaceOAR gel in reducing the rectal toxicity associated with radiotherapy.   He appears to have a good understanding of his disease and our treatment recommendations which are of curative intent.  He was encouraged to ask questions that were answered to his stated satisfaction.  At the conclusion of our conversation, the patient is interested in moving forward with 5 1/2 weeks of IMRT starting in early January.  I spent 60 minutes total in the encounter.   ------------------------------------------------   Tyler Pita, MD [place] Director and Director of Stereotactic Radiosurgery Direct Dial: [phone]  Fax: [phone] [email or web address]  Skype  [email or web address]   This document serves as a record of services personally performed by Tyler Pita, MD. It was created on  his behalf by Wilburn Mylar, a  trained medical scribe. The creation of this record is based on the scribe's personal observations and the provider's statements to them. This document has been checked and approved by the attending provider.

## 2020-02-20 ENCOUNTER — Telehealth: Payer: Self-pay | Admitting: *Deleted

## 2020-02-20 NOTE — Telephone Encounter (Signed)
Called patient to inform of fid.markes and space oar placement on 03-05-20 @ [facility] and his sim on 03-11-20 - arrival time- 2:15 pm @ [facility], spoke with patient's wife- Marlowe Kays and she is aware of these appts.

## 2020-03-10 ENCOUNTER — Telehealth: Payer: Self-pay | Admitting: *Deleted

## 2020-03-10 NOTE — Telephone Encounter (Signed)
Called patient's wife- Marlowe Kays to remind of sim appt. for 03-11-20- arrival time- 2:15 pm, spoke with patient's wife- Marlowe Kays and she is aware of this appt.

## 2020-03-11 ENCOUNTER — Ambulatory Visit
Admission: RE | Admit: 2020-03-11 | Discharge: 2020-03-11 | Disposition: A | Discharge status: Home / Self Care | Payer: Medicare Other | Source: Ambulatory Visit | Attending: Radiation Oncology | Admitting: Radiation Oncology

## 2020-03-11 ENCOUNTER — Encounter: Payer: Self-pay | Admitting: Medical Oncology

## 2020-03-11 DIAGNOSIS — C61 Malignant neoplasm of prostate: Secondary | ICD-10-CM | POA: Diagnosis present

## 2020-03-11 DIAGNOSIS — Z510 Encounter for antineoplastic radiation therapy: Secondary | ICD-10-CM | POA: Diagnosis not present

## 2020-03-11 NOTE — Progress Notes (Signed)
  Radiation Oncology         [phone] ________________________________  Name: Alex Mcmahon MRN: [id number]  Date: 03/11/2020  DOB: 1940/08/25  SIMULATION AND TREATMENT PLANNING NOTE    ICD-10-CM   1. Cancer of prostate [facility])  C61     DIAGNOSIS:  80 y.o. gentleman with Stage T1c adenocarcinoma of the prostate with Gleason Score of 3+4, and PSA of 6.94.  NARRATIVE:  The patient was brought to the [facility].  Identity was confirmed.  All relevant records and images related to the planned course of therapy were reviewed.  The patient freely provided informed written consent to proceed with treatment after reviewing the details related to the planned course of therapy. The consent form was witnessed and verified by the simulation staff.  Then, the patient was set-up in a stable reproducible supine position for radiation therapy.  A vacuum lock pillow device was custom fabricated to position his legs in a reproducible immobilized position.  Then, I performed a urethrogram under sterile conditions to identify the prostatic apex.  CT images were obtained.  Surface markings were placed.  The CT images were loaded into the planning software.  Then the prostate target and avoidance structures including the rectum, bladder, bowel and hips were contoured.  Treatment planning then occurred.  The radiation prescription was entered and confirmed.  A total of one complex treatment devices was fabricated. I have requested : Intensity Modulated Radiotherapy (IMRT) is medically necessary for this case for the following reason:  Rectal sparing.Marland Kitchen  PLAN:  The patient will receive 70 Gy in 28 fractions.  ________________________________  Sheral Apley Tammi Klippel, M.D.  This document serves as a record of services personally performed by Tyler Pita, MD. It was created on his behalf by Wilburn Mylar, a trained medical scribe. The creation of this record is based on the scribe's personal  observations and the provider's statements to them. This document has been checked and approved by the attending provider.

## 2020-03-17 DIAGNOSIS — Z510 Encounter for antineoplastic radiation therapy: Secondary | ICD-10-CM | POA: Diagnosis not present

## 2020-03-20 ENCOUNTER — Other Ambulatory Visit: Payer: Self-pay

## 2020-03-20 ENCOUNTER — Ambulatory Visit
Admission: RE | Admit: 2020-03-20 | Discharge: 2020-03-20 | Disposition: A | Discharge status: Home / Self Care | Payer: Medicare Other | Source: Ambulatory Visit | Attending: Radiation Oncology | Admitting: Radiation Oncology

## 2020-03-20 DIAGNOSIS — Z510 Encounter for antineoplastic radiation therapy: Secondary | ICD-10-CM | POA: Diagnosis not present

## 2020-03-21 ENCOUNTER — Ambulatory Visit
Admission: RE | Admit: 2020-03-21 | Discharge: 2020-03-21 | Disposition: A | Discharge status: Home / Self Care | Payer: Medicare Other | Source: Ambulatory Visit | Attending: Radiation Oncology | Admitting: Radiation Oncology

## 2020-03-21 ENCOUNTER — Other Ambulatory Visit: Payer: Self-pay

## 2020-03-21 DIAGNOSIS — Z510 Encounter for antineoplastic radiation therapy: Secondary | ICD-10-CM | POA: Diagnosis not present

## 2020-03-24 ENCOUNTER — Other Ambulatory Visit: Payer: Self-pay

## 2020-03-24 ENCOUNTER — Ambulatory Visit
Admission: RE | Admit: 2020-03-24 | Discharge: 2020-03-24 | Disposition: A | Discharge status: Home / Self Care | Payer: Medicare Other | Source: Ambulatory Visit | Attending: Radiation Oncology | Admitting: Radiation Oncology

## 2020-03-24 DIAGNOSIS — Z510 Encounter for antineoplastic radiation therapy: Secondary | ICD-10-CM | POA: Diagnosis not present

## 2020-03-25 ENCOUNTER — Other Ambulatory Visit: Payer: Self-pay

## 2020-03-25 ENCOUNTER — Ambulatory Visit
Admission: RE | Admit: 2020-03-25 | Discharge: 2020-03-25 | Disposition: A | Discharge status: Home / Self Care | Payer: Medicare Other | Source: Ambulatory Visit | Attending: Radiation Oncology | Admitting: Radiation Oncology

## 2020-03-25 DIAGNOSIS — Z510 Encounter for antineoplastic radiation therapy: Secondary | ICD-10-CM | POA: Insufficient documentation

## 2020-03-25 DIAGNOSIS — C61 Malignant neoplasm of prostate: Secondary | ICD-10-CM | POA: Diagnosis present

## 2020-03-26 ENCOUNTER — Other Ambulatory Visit: Payer: Self-pay

## 2020-03-26 ENCOUNTER — Ambulatory Visit
Admission: RE | Admit: 2020-03-26 | Discharge: 2020-03-26 | Disposition: A | Discharge status: Home / Self Care | Payer: Medicare Other | Source: Ambulatory Visit | Attending: Radiation Oncology | Admitting: Radiation Oncology

## 2020-03-26 DIAGNOSIS — Z510 Encounter for antineoplastic radiation therapy: Secondary | ICD-10-CM | POA: Diagnosis not present

## 2020-03-27 ENCOUNTER — Other Ambulatory Visit: Payer: Self-pay

## 2020-03-27 ENCOUNTER — Ambulatory Visit
Admission: RE | Admit: 2020-03-27 | Discharge: 2020-03-27 | Disposition: A | Discharge status: Home / Self Care | Payer: Medicare Other | Source: Ambulatory Visit | Attending: Radiation Oncology | Admitting: Radiation Oncology

## 2020-03-27 DIAGNOSIS — Z510 Encounter for antineoplastic radiation therapy: Secondary | ICD-10-CM | POA: Diagnosis not present

## 2020-03-28 ENCOUNTER — Ambulatory Visit
Admission: RE | Admit: 2020-03-28 | Discharge: 2020-03-28 | Disposition: A | Discharge status: Home / Self Care | Payer: Medicare Other | Source: Ambulatory Visit | Attending: Radiation Oncology | Admitting: Radiation Oncology

## 2020-03-28 ENCOUNTER — Other Ambulatory Visit: Payer: Self-pay

## 2020-03-28 DIAGNOSIS — Z510 Encounter for antineoplastic radiation therapy: Secondary | ICD-10-CM | POA: Diagnosis not present

## 2020-03-31 ENCOUNTER — Ambulatory Visit
Admission: RE | Admit: 2020-03-31 | Discharge: 2020-03-31 | Disposition: A | Discharge status: Home / Self Care | Payer: Medicare Other | Source: Ambulatory Visit | Attending: Radiation Oncology | Admitting: Radiation Oncology

## 2020-03-31 ENCOUNTER — Other Ambulatory Visit: Payer: Self-pay

## 2020-03-31 DIAGNOSIS — Z510 Encounter for antineoplastic radiation therapy: Secondary | ICD-10-CM | POA: Diagnosis not present

## 2020-04-01 ENCOUNTER — Ambulatory Visit
Admission: RE | Admit: 2020-04-01 | Discharge: 2020-04-01 | Disposition: A | Discharge status: Home / Self Care | Payer: Medicare Other | Source: Ambulatory Visit | Attending: Radiation Oncology | Admitting: Radiation Oncology

## 2020-04-01 ENCOUNTER — Other Ambulatory Visit: Payer: Self-pay

## 2020-04-01 DIAGNOSIS — Z510 Encounter for antineoplastic radiation therapy: Secondary | ICD-10-CM | POA: Diagnosis not present

## 2020-04-02 ENCOUNTER — Ambulatory Visit
Admission: RE | Admit: 2020-04-02 | Discharge: 2020-04-02 | Disposition: A | Discharge status: Home / Self Care | Payer: Medicare Other | Source: Ambulatory Visit | Attending: Radiation Oncology | Admitting: Radiation Oncology

## 2020-04-02 ENCOUNTER — Other Ambulatory Visit: Payer: Self-pay

## 2020-04-02 DIAGNOSIS — Z510 Encounter for antineoplastic radiation therapy: Secondary | ICD-10-CM | POA: Diagnosis not present

## 2020-04-03 ENCOUNTER — Other Ambulatory Visit: Payer: Self-pay

## 2020-04-03 ENCOUNTER — Ambulatory Visit
Admission: RE | Admit: 2020-04-03 | Discharge: 2020-04-03 | Disposition: A | Discharge status: Home / Self Care | Payer: Medicare Other | Source: Ambulatory Visit | Attending: Radiation Oncology | Admitting: Radiation Oncology

## 2020-04-03 DIAGNOSIS — Z510 Encounter for antineoplastic radiation therapy: Secondary | ICD-10-CM | POA: Diagnosis not present

## 2020-04-04 ENCOUNTER — Other Ambulatory Visit: Payer: Self-pay

## 2020-04-04 ENCOUNTER — Ambulatory Visit
Admission: RE | Admit: 2020-04-04 | Discharge: 2020-04-04 | Disposition: A | Discharge status: Home / Self Care | Payer: Medicare Other | Source: Ambulatory Visit | Attending: Radiation Oncology | Admitting: Radiation Oncology

## 2020-04-04 DIAGNOSIS — Z510 Encounter for antineoplastic radiation therapy: Secondary | ICD-10-CM | POA: Diagnosis not present

## 2020-04-07 ENCOUNTER — Other Ambulatory Visit: Payer: Self-pay

## 2020-04-07 ENCOUNTER — Ambulatory Visit
Admission: RE | Admit: 2020-04-07 | Discharge: 2020-04-07 | Disposition: A | Discharge status: Home / Self Care | Payer: Medicare Other | Source: Ambulatory Visit | Attending: Radiation Oncology | Admitting: Radiation Oncology

## 2020-04-07 DIAGNOSIS — Z510 Encounter for antineoplastic radiation therapy: Secondary | ICD-10-CM | POA: Diagnosis not present

## 2020-04-08 ENCOUNTER — Ambulatory Visit
Admission: RE | Admit: 2020-04-08 | Discharge: 2020-04-08 | Disposition: A | Discharge status: Home / Self Care | Payer: Medicare Other | Source: Ambulatory Visit | Attending: Radiation Oncology | Admitting: Radiation Oncology

## 2020-04-08 ENCOUNTER — Other Ambulatory Visit: Payer: Self-pay

## 2020-04-08 DIAGNOSIS — Z510 Encounter for antineoplastic radiation therapy: Secondary | ICD-10-CM | POA: Diagnosis not present

## 2020-04-09 ENCOUNTER — Ambulatory Visit
Admission: RE | Admit: 2020-04-09 | Discharge: 2020-04-09 | Disposition: A | Discharge status: Home / Self Care | Payer: Medicare Other | Source: Ambulatory Visit | Attending: Radiation Oncology | Admitting: Radiation Oncology

## 2020-04-09 ENCOUNTER — Other Ambulatory Visit: Payer: Self-pay

## 2020-04-09 DIAGNOSIS — Z510 Encounter for antineoplastic radiation therapy: Secondary | ICD-10-CM | POA: Diagnosis not present

## 2020-04-10 ENCOUNTER — Ambulatory Visit
Admission: RE | Admit: 2020-04-10 | Discharge: 2020-04-10 | Disposition: A | Discharge status: Home / Self Care | Payer: Medicare Other | Source: Ambulatory Visit | Attending: Radiation Oncology | Admitting: Radiation Oncology

## 2020-04-10 DIAGNOSIS — Z510 Encounter for antineoplastic radiation therapy: Secondary | ICD-10-CM | POA: Diagnosis not present

## 2020-04-11 ENCOUNTER — Ambulatory Visit
Admission: RE | Admit: 2020-04-11 | Discharge: 2020-04-11 | Disposition: A | Discharge status: Home / Self Care | Payer: Medicare Other | Source: Ambulatory Visit | Attending: Radiation Oncology | Admitting: Radiation Oncology

## 2020-04-11 ENCOUNTER — Other Ambulatory Visit: Payer: Self-pay

## 2020-04-11 DIAGNOSIS — Z510 Encounter for antineoplastic radiation therapy: Secondary | ICD-10-CM | POA: Diagnosis not present

## 2020-04-14 ENCOUNTER — Ambulatory Visit
Admission: RE | Admit: 2020-04-14 | Discharge: 2020-04-14 | Disposition: A | Discharge status: Home / Self Care | Payer: Medicare Other | Source: Ambulatory Visit | Attending: Radiation Oncology | Admitting: Radiation Oncology

## 2020-04-14 ENCOUNTER — Other Ambulatory Visit: Payer: Self-pay

## 2020-04-14 DIAGNOSIS — Z510 Encounter for antineoplastic radiation therapy: Secondary | ICD-10-CM | POA: Diagnosis not present

## 2020-04-15 ENCOUNTER — Ambulatory Visit
Admission: RE | Admit: 2020-04-15 | Discharge: 2020-04-15 | Disposition: A | Discharge status: Home / Self Care | Payer: Medicare Other | Source: Ambulatory Visit | Attending: Radiation Oncology | Admitting: Radiation Oncology

## 2020-04-15 DIAGNOSIS — Z510 Encounter for antineoplastic radiation therapy: Secondary | ICD-10-CM | POA: Diagnosis not present

## 2020-04-16 ENCOUNTER — Ambulatory Visit
Admission: RE | Admit: 2020-04-16 | Discharge: 2020-04-16 | Disposition: A | Discharge status: Home / Self Care | Payer: Medicare Other | Source: Ambulatory Visit | Attending: Radiation Oncology | Admitting: Radiation Oncology

## 2020-04-16 ENCOUNTER — Other Ambulatory Visit: Payer: Self-pay

## 2020-04-16 DIAGNOSIS — Z510 Encounter for antineoplastic radiation therapy: Secondary | ICD-10-CM | POA: Diagnosis not present

## 2020-04-17 ENCOUNTER — Ambulatory Visit
Admission: RE | Admit: 2020-04-17 | Discharge: 2020-04-17 | Disposition: A | Discharge status: Home / Self Care | Payer: Medicare Other | Source: Ambulatory Visit | Attending: Radiation Oncology | Admitting: Radiation Oncology

## 2020-04-17 DIAGNOSIS — Z510 Encounter for antineoplastic radiation therapy: Secondary | ICD-10-CM | POA: Diagnosis not present

## 2020-04-18 ENCOUNTER — Ambulatory Visit
Admission: RE | Admit: 2020-04-18 | Discharge: 2020-04-18 | Disposition: A | Discharge status: Home / Self Care | Payer: Medicare Other | Source: Ambulatory Visit | Attending: Radiation Oncology | Admitting: Radiation Oncology

## 2020-04-18 DIAGNOSIS — Z510 Encounter for antineoplastic radiation therapy: Secondary | ICD-10-CM | POA: Diagnosis not present

## 2020-04-21 ENCOUNTER — Ambulatory Visit
Admission: RE | Admit: 2020-04-21 | Discharge: 2020-04-21 | Disposition: A | Discharge status: Home / Self Care | Payer: Medicare Other | Source: Ambulatory Visit | Attending: Radiation Oncology | Admitting: Radiation Oncology

## 2020-04-21 ENCOUNTER — Other Ambulatory Visit: Payer: Self-pay

## 2020-04-21 DIAGNOSIS — Z510 Encounter for antineoplastic radiation therapy: Secondary | ICD-10-CM | POA: Diagnosis not present

## 2020-04-22 ENCOUNTER — Ambulatory Visit
Admission: RE | Admit: 2020-04-22 | Discharge: 2020-04-22 | Disposition: A | Discharge status: Home / Self Care | Payer: Medicare Other | Source: Ambulatory Visit | Attending: Radiation Oncology | Admitting: Radiation Oncology

## 2020-04-22 DIAGNOSIS — Z510 Encounter for antineoplastic radiation therapy: Secondary | ICD-10-CM | POA: Diagnosis not present

## 2020-04-23 ENCOUNTER — Ambulatory Visit: Admission: RE | Admit: 2020-04-23 | Payer: Medicare Other | Source: Ambulatory Visit

## 2020-04-23 ENCOUNTER — Other Ambulatory Visit: Payer: Self-pay

## 2020-04-24 ENCOUNTER — Ambulatory Visit
Admission: RE | Admit: 2020-04-24 | Discharge: 2020-04-24 | Disposition: A | Discharge status: Home / Self Care | Payer: Medicare Other | Source: Ambulatory Visit | Attending: Radiation Oncology | Admitting: Radiation Oncology

## 2020-04-24 ENCOUNTER — Other Ambulatory Visit: Payer: Self-pay

## 2020-04-24 DIAGNOSIS — Z510 Encounter for antineoplastic radiation therapy: Secondary | ICD-10-CM | POA: Diagnosis not present

## 2020-04-25 ENCOUNTER — Ambulatory Visit
Admission: RE | Admit: 2020-04-25 | Discharge: 2020-04-25 | Disposition: A | Discharge status: Home / Self Care | Payer: Medicare Other | Source: Ambulatory Visit | Attending: Radiation Oncology | Admitting: Radiation Oncology

## 2020-04-25 ENCOUNTER — Other Ambulatory Visit: Payer: Self-pay

## 2020-04-25 DIAGNOSIS — Z510 Encounter for antineoplastic radiation therapy: Secondary | ICD-10-CM | POA: Insufficient documentation

## 2020-04-25 DIAGNOSIS — C61 Malignant neoplasm of prostate: Secondary | ICD-10-CM | POA: Diagnosis present

## 2020-04-28 ENCOUNTER — Ambulatory Visit: Payer: Medicare Other

## 2020-04-28 ENCOUNTER — Other Ambulatory Visit: Payer: Self-pay

## 2020-04-28 ENCOUNTER — Ambulatory Visit
Admission: RE | Admit: 2020-04-28 | Discharge: 2020-04-28 | Disposition: A | Discharge status: Home / Self Care | Payer: Medicare Other | Source: Ambulatory Visit | Attending: Radiation Oncology | Admitting: Radiation Oncology

## 2020-04-28 DIAGNOSIS — Z510 Encounter for antineoplastic radiation therapy: Secondary | ICD-10-CM | POA: Diagnosis not present

## 2020-04-29 ENCOUNTER — Ambulatory Visit
Admission: RE | Admit: 2020-04-29 | Discharge: 2020-04-29 | Disposition: A | Discharge status: Home / Self Care | Payer: Medicare Other | Source: Ambulatory Visit | Attending: Radiation Oncology | Admitting: Radiation Oncology

## 2020-04-29 ENCOUNTER — Encounter: Payer: Self-pay | Admitting: Urology

## 2020-04-29 DIAGNOSIS — Z510 Encounter for antineoplastic radiation therapy: Secondary | ICD-10-CM | POA: Diagnosis not present

## 2020-05-12 ENCOUNTER — Telehealth: Payer: Self-pay | Admitting: Cardiology

## 2020-05-12 NOTE — Telephone Encounter (Signed)
4.18.2022 Spoke to patient's wife to schedule one year fu w/Dr Stanford Breed. Wife was not aware pt need follow up. She states he is doing fine and did not want to schedule at this time.  LParmele

## 2020-05-28 NOTE — Progress Notes (Signed)
Patient IPPS was  a 1. Patient denies any dysuria or hematuria.Patient reports nocturia x1.Patient denies any leakage or urgency. Patient reports a  strong stream. Patient states that he empties his bladder with urination. Patient denies having to push or strain to start his stream. Patient reports a  straight stream. Patient states that he has some constipation,but states that it resolves it self. Patient states that he will schedule a follow-up appointment  with his urologist.

## 2020-05-29 ENCOUNTER — Ambulatory Visit
Admission: RE | Admit: 2020-05-29 | Discharge: 2020-05-29 | Disposition: A | Discharge status: Home / Self Care | Payer: Medicare Other | Source: Ambulatory Visit | Attending: Urology | Admitting: Urology

## 2020-05-29 DIAGNOSIS — C61 Malignant neoplasm of prostate: Secondary | ICD-10-CM

## 2020-05-29 NOTE — Progress Notes (Signed)
  Radiation Oncology         [phone] ________________________________  Name: Alex Mcmahon MRN: [id number]  Date: 04/29/2020  DOB: 08/24/1940  End of Treatment Note  Diagnosis:   80 y.o. gentleman with Stage T1c adenocarcinoma of the prostate with Gleason score of 3+4, and PSA of 6.94.     Indication for treatment:  Curative, Definitive Radiotherapy       Radiation treatment dates:   03/20/20 - 04/29/20  Site/dose:   The prostate was treated to 70 Gy in 28 fractions of 2.5 Gy  Beams/energy:   The patient was treated with IMRT using volumetric arc therapy delivering 6 MV X-rays to clockwise and counterclockwise circumferential arcs with a 90 degree collimator offset to avoid dose scalloping.  Image guidance was performed with daily cone beam CT prior to each fraction to align to gold markers in the prostate and assure proper bladder and rectal fill volumes.  Immobilization was achieved with BodyFix custom mold.  Narrative: The patient tolerated radiation treatment relatively well with only minor urinary irritation and modest fatigue.  He did experience increased frequency, nocturia 3x/night, hesitancy, intermittency and mild dysuria. He also reported diarrhea that was managed with dietary modifications only and did not require medications.   Plan: The patient has completed radiation treatment. He will return to radiation oncology clinic for routine followup in one month. I advised him to call or return sooner if he has any questions or concerns related to his recovery or treatment. ________________________________  Sheral Apley. Tammi Klippel, M.D.

## 2020-05-29 NOTE — Progress Notes (Signed)
Radiation Oncology         [phone] ________________________________  Name: Alex Mcmahon MRN: [id number]  Date: 05/29/2020  DOB: Jun 24, 1940  Post Treatment Note  CC: Berkley Harvey, NP  Irine Seal, MD  Diagnosis:   80 y.o.gentleman with Stage T1cadenocarcinoma of the prostate with Gleason score of 3+4, and PSA of6.94.    Interval Since Last Radiation:  4 weeks  03/20/20 - 04/29/20: The prostate was treated to 70 Gy in 28 fractions of 2.5 Gy  Narrative:  I spoke with the patient to conduct his routine scheduled 1 month follow up visit via telephone to spare the patient unnecessary potential exposure in the healthcare setting during the current COVID-19 pandemic.  The patient was notified in advance and gave permission to proceed with this visit format.  He tolerated radiation treatment relatively well with only minor urinary irritation and modest fatigue.  He did experience increased frequency, nocturia 3x/night, hesitancy, intermittency and mild dysuria. He also reported diarrhea that was managed with dietary modifications only and did not require medications.                               On review of systems, the patient states that he is doing very well in general.  His LUTS are gradually improving and very manageable at this point. He specifically denies dysuria, gross hematuria, straining to void, incomplete emptying or incontinence.  Current IPSS is 1 with nocturia x1/night. He reports a healthy appetite and is maintaining his weight. Bowels are almost back to normal and he denies abdominal pain, nausea, vomiting or diarrhea. Occasionally will have some constipation. He has mild residual fatigue which is gradually improving as well.  Overall, he is quite pleased with his progress to date.  ALLERGIES:  is allergic to lisinopril and docosahexaenoic acid (dha).  Meds: Current Outpatient Medications  Medication Sig Dispense Refill  . amLODipine (NORVASC) 10 MG tablet Take 10 mg  by mouth daily with breakfast.     . aspirin 81 MG tablet Take 81 mg by mouth daily with breakfast.     . cloNIDine (CATAPRES) 0.2 MG tablet Take 0.2 mg by mouth 2 (two) times daily. Reported on 02/27/2015    . diclofenac Sodium (VOLTAREN) 1 % GEL Use 4 grams to affected knee every 8 hours if needed    . glucosamine-chondroitin 500-400 MG tablet Take 2 tablets by mouth daily.    . hydrochlorothiazide (HYDRODIURIL) 25 MG tablet Take 1 tablet by mouth daily.    . meloxicam (MOBIC) 15 MG tablet Take 15 mg by mouth daily with breakfast.     . metoprolol tartrate (LOPRESSOR) 100 MG tablet TAKE 2 HOURS PRIOR TO CT SCAN 1 tablet 0  . metoprolol tartrate (LOPRESSOR) 25 MG tablet Take 25 mg by mouth.    . Multiple Vitamin (MULTIVITAMIN) tablet Take 1 tablet by mouth daily. One a day men's vitamin    . fluticasone (FLONASE) 50 MCG/ACT nasal spray 2 sprays by Each Nare route daily. (Patient not taking: Reported on 05/28/2020)    . gabapentin (NEURONTIN) 300 MG capsule Take one at HS x one week, then one po bid (Patient not taking: Reported on 05/28/2020)    . losartan-hydrochlorothiazide (HYZAAR) 100-25 MG tablet Take by mouth.    . olmesartan (BENICAR) 40 MG tablet Take 1 tablet by mouth daily. (Patient not taking: Reported on 05/28/2020)    . omega-3 acid ethyl esters (LOVAZA)  1 G capsule Take 2 g by mouth 2 (two) times daily. (Patient not taking: Reported on 05/28/2020)    . rosuvastatin (CRESTOR) 5 MG tablet Take by mouth. (Patient not taking: Reported on 05/28/2020)     No current facility-administered medications for this encounter.    Physical Findings:  vitals were not taken for this visit.   /Unable to assess due to telephone follow up visit format.   Lab Findings: No results found for: WBC, HGB, HCT, MCV, PLT   Radiographic Findings: No results found.  Impression/Plan: 1. 80 y.o.gentleman with Stage T1cadenocarcinoma of the prostate with Gleason score of 3+4, and PSA of6.94.  He will continue  to follow up with urology for ongoing PSA determinations and has an appointment scheduled for labs on 06/30/20 and will see Dr. Jeffie Pollock the following week. He understands what to expect with regards to PSA monitoring going forward. I will look forward to following his response to treatment via correspondence with urology, and would be happy to continue to participate in his care if clinically indicated. I talked to the patient about what to expect in the future, including his risk for erectile dysfunction and rectal bleeding. I encouraged him to call or return to the office if he has any questions regarding his previous radiation or possible radiation side effects. He was comfortable with this plan and will follow up as needed.     Nicholos Johns, PA-C

## 2020-08-25 ENCOUNTER — Encounter: Payer: Self-pay | Admitting: Gastroenterology

## 2020-12-22 ENCOUNTER — Encounter (HOSPITAL_COMMUNITY): Payer: Self-pay

## 2020-12-22 ENCOUNTER — Emergency Department (HOSPITAL_COMMUNITY)
Admission: EM | Admit: 2020-12-22 | Discharge: 2020-12-22 | Disposition: A | Discharge status: Home / Self Care | Payer: Medicare Other | Attending: Emergency Medicine | Admitting: Emergency Medicine

## 2020-12-22 ENCOUNTER — Other Ambulatory Visit: Payer: Self-pay

## 2020-12-22 DIAGNOSIS — I10 Essential (primary) hypertension: Secondary | ICD-10-CM | POA: Diagnosis not present

## 2020-12-22 DIAGNOSIS — Z79899 Other long term (current) drug therapy: Secondary | ICD-10-CM | POA: Insufficient documentation

## 2020-12-22 DIAGNOSIS — L509 Urticaria, unspecified: Secondary | ICD-10-CM | POA: Diagnosis not present

## 2020-12-22 DIAGNOSIS — Z85828 Personal history of other malignant neoplasm of skin: Secondary | ICD-10-CM | POA: Diagnosis not present

## 2020-12-22 DIAGNOSIS — T782XXA Anaphylactic shock, unspecified, initial encounter: Secondary | ICD-10-CM | POA: Insufficient documentation

## 2020-12-22 DIAGNOSIS — Z8546 Personal history of malignant neoplasm of prostate: Secondary | ICD-10-CM | POA: Insufficient documentation

## 2020-12-22 DIAGNOSIS — T7840XA Allergy, unspecified, initial encounter: Secondary | ICD-10-CM | POA: Diagnosis present

## 2020-12-22 DIAGNOSIS — Z7982 Long term (current) use of aspirin: Secondary | ICD-10-CM | POA: Insufficient documentation

## 2020-12-22 MED ORDER — EPINEPHRINE 0.3 MG/0.3ML IJ SOAJ
0.3000 mg | INTRAMUSCULAR | 0 refills | Status: AC | PRN
Start: 2020-12-22 — End: ?

## 2020-12-22 MED ORDER — METHYLPREDNISOLONE SODIUM SUCC 125 MG IJ SOLR
125.0000 mg | Freq: Once | INTRAMUSCULAR | Status: AC
  Administered 2020-12-22: 16:00:00 125 mg via INTRAVENOUS
  Filled 2020-12-22: qty 2

## 2020-12-22 NOTE — Discharge Instructions (Addendum)
All 911 and return to the ER for return of symptoms. Use Epi pen as needed as discussed. Continue with Benadryl and Zyrtec for the next 1-2 days. Add Pepcid as needed for additional relief.

## 2020-12-22 NOTE — ED Notes (Signed)
Pt discharged and ambulated out of the ED without difficulty. 

## 2020-12-22 NOTE — ED Provider Notes (Signed)
[facility] EMERGENCY DEPARTMENT Provider Note   CSN: [id number] Arrival date & time: 12/22/20  1511     History Chief Complaint  Patient presents with   Allergic Reaction    Alex Mcmahon is a 80 y.o. male.  80 year male brought in by EMS for allergic reaction. Patient reports picking up something in a yard that had ants on it and he brushed them off his hand and didn't thing much of it. He then was driving home when he began to itch all over, feel his tongue swell and began to have difficulty breathing. Patient pulled over and called 911. EMS arrived and gave epi x 2, benadryl. Patient reports feeling much better upon arrival in the ER, states his tongue is still a little swollen. No wheezing or difficulty breathing, itching has improved, still having some mild left axillary itching. No history of allergic reaction previously.       Past Medical History:  Diagnosis Date   Anxiety    Hyperlipidemia    Hypertension 1980's   Prostate cancer [facility])    Skin cancer     Patient Active Problem List   Diagnosis Date Noted   Chronic pain of both knees 01/17/2019   Cancer of prostate ([place]) 06/27/2017   Screening for prostate cancer 06/28/2016   Lumbar stenosis 05/08/2015   Acute back pain with sciatica 03/09/2015   Benign hypertension 03/21/2013   Combined fat and carbohydrate induced hyperlipemia 03/21/2013   Cataract 03/20/2012   Encounter for general adult medical examination without abnormal findings 03/20/2012   Routine history and physical examination of adult 03/20/2012   Generalized degenerative joint disease of hand 02/17/2011   Hypertriglyceridemia 02/25/2010   H/O renal calculi 01/04/2008   History of colon polyps 08/23/2007    Past Surgical History:  Procedure Laterality Date   CARDIAC CATHETERIZATION     COLONOSCOPY     FINGER SURGERY  1970's   POLYPECTOMY         Family History  Problem Relation Age of Onset   Heart disease Mother     Heart disease Father    Colon cancer Neg Hx    Colon polyps Neg Hx    Rectal cancer Neg Hx    Stomach cancer Neg Hx    Prostate cancer Neg Hx    Breast cancer Neg Hx     Social History   Tobacco Use   Smoking status: Never   Smokeless tobacco: Never  Vaping Use   Vaping Use: Never used  Substance Use Topics   Alcohol use: No    Alcohol/week: 0.0 standard drinks   Drug use: No    Home Medications Prior to Admission medications   Medication Sig Start Date End Date Taking? Authorizing Provider  EPINEPHrine 0.3 mg/0.3 mL IJ SOAJ injection Inject 0.3 mg into the muscle as needed for anaphylaxis. 12/22/20  Yes Tacy Learn, PA-C  amLODipine (NORVASC) 10 MG tablet Take 10 mg by mouth daily with breakfast.     [provider]  aspirin 81 MG tablet Take 81 mg by mouth daily with breakfast.     [provider]  cloNIDine (CATAPRES) 0.2 MG tablet Take 0.2 mg by mouth 2 (two) times daily. Reported on 02/27/2015    [provider]  diclofenac Sodium (VOLTAREN) 1 % GEL Use 4 grams to affected knee every 8 hours if needed 03/20/18   [provider]  fluticasone (FLONASE) 50 MCG/ACT nasal spray 2 sprays by Each  Nare route daily. Patient not taking: Reported on 05/28/2020 05/06/16   [provider]  gabapentin (NEURONTIN) 300 MG capsule Take one at HS x one week, then one po bid Patient not taking: Reported on 05/28/2020 06/27/17   [provider]  glucosamine-chondroitin 500-400 MG tablet Take 2 tablets by mouth daily.    [provider]  hydrochlorothiazide (HYDRODIURIL) 25 MG tablet Take 1 tablet by mouth daily. 09/17/19   [provider]  losartan-hydrochlorothiazide (HYZAAR) 100-25 MG tablet Take by mouth. 10/14/14 10/14/15  [provider]  meloxicam (MOBIC) 15 MG tablet Take 15 mg by mouth daily with breakfast.     [provider]  metoprolol tartrate (LOPRESSOR) 100 MG tablet TAKE 2 HOURS PRIOR TO CT SCAN  05/21/19   Lelon Perla, MD  metoprolol tartrate (LOPRESSOR) 25 MG tablet Take 25 mg by mouth. 08/28/14   [provider]  Multiple Vitamin (MULTIVITAMIN) tablet Take 1 tablet by mouth daily. One a day men's vitamin    [provider]  olmesartan (BENICAR) 40 MG tablet Take 1 tablet by mouth daily. Patient not taking: Reported on 05/28/2020 11/24/19   [provider]  omega-3 acid ethyl esters (LOVAZA) 1 G capsule Take 2 g by mouth 2 (two) times daily. Patient not taking: Reported on 05/28/2020    [provider]  rosuvastatin (CRESTOR) 5 MG tablet Take by mouth. Patient not taking: Reported on 05/28/2020 05/18/19   [provider]    Allergies    Lisinopril and Docosahexaenoic acid (dha)  Review of Systems   Review of Systems  Constitutional:  Negative for chills and fever.  HENT:  Positive for voice change. Negative for trouble swallowing.   Respiratory:  Negative for cough and shortness of breath.   Cardiovascular:  Negative for chest pain.  Gastrointestinal:  Negative for nausea and vomiting.  Musculoskeletal:  Negative for arthralgias, joint swelling and myalgias.  Skin:  Positive for rash.  Allergic/Immunologic: Negative for immunocompromised state.  Neurological:  Negative for weakness.  Psychiatric/Behavioral:  Negative for confusion.   All other systems reviewed and are negative.  Physical Exam Updated Vital Signs BP (!) 146/74   Pulse 87   Temp 98 F (36.7 C) (Oral)   Resp 16   Ht 5\' 8"  (1.727 m)   Wt 81 kg   SpO2 95%   BMI 27.15 kg/m   Physical Exam Vitals and nursing note reviewed.  Constitutional:      General: He is not in acute distress.    Appearance: He is well-developed. He is not diaphoretic.  HENT:     Head: Normocephalic and atraumatic.     Comments: No obvious oral swelling, reports feeling like his tongue is still a little full    Mouth/Throat:     Mouth: Mucous membranes are moist.     Pharynx: No  oropharyngeal exudate or posterior oropharyngeal erythema.  Eyes:     Conjunctiva/sclera: Conjunctivae normal.  Cardiovascular:     Rate and Rhythm: Normal rate and regular rhythm.     Heart sounds: Normal heart sounds.  Pulmonary:     Effort: Pulmonary effort is normal.     Breath sounds: Normal breath sounds.  Musculoskeletal:        General: No swelling, tenderness or deformity.     Cervical back: Neck supple.  Skin:    General: Skin is warm and dry.     Findings: Rash present.     Comments: Mild urticaria to left thumb/webspace  between thumb and index finger. Also to left axilla.   Neurological:     Mental Status: He is alert and oriented to person, place, and time.     Sensory: No sensory deficit.     Motor: No weakness.  Psychiatric:        Behavior: Behavior normal.    ED Results / Procedures / Treatments   Labs (all labs ordered are listed, but only abnormal results are displayed) Labs Reviewed - No data to display  EKG None  Radiology No results found.  Procedures Procedures   Medications Ordered in ED Medications  methylPREDNISolone sodium succinate (SOLU-MEDROL) 125 mg/2 mL injection 125 mg (125 mg Intravenous Given 12/22/20 1548)    ED Course  I have reviewed the triage vital signs and the nursing notes.  Pertinent labs & imaging results that were available during my care of the patient were reviewed by me and considered in my medical decision making (see chart for details).  Clinical Course as of 12/22/20 1854  Mon Dec 22, 8468  2156 80 year old male brought in by EMS for allergic reaction/anaphylaxis from an ant bite today.  Patient reports itching after the exposure, developed swelling of the tongue and difficulty breathing as he was driving and pulled over to call 911. EMS found patient to be hypotensive, gave epi x 2 and benadryl. Arrives in the ER with improvement in symptoms, still feels like his tongue is a little swollen and itching. Found to  have urticaria to left axilla and left hand. No significant swelling to tongue, tolerating secretions, lungs CTA.  Patient was given solumedrol and observed in the ER. [LM]  1831 On recheck, patient is feeling better and would like to be discharged.  Vitals stable. Speech clear. Reports tongue swelling has totally resolved.  Discussed with Dr. Zenia Resides, ER attending. Plan is to complete 4 hour observation.  [LM]  1838 Plan is for dc with epi pen with strict return to ER/call 911 precautions for return of symptoms. Patient will pick up the epi pen rx today. Also have pepcid and benadryl on hand. Continue with Benadryl through tomorrow and as needed. [LM]    Clinical Course User Index [LM] Roque Lias   MDM Rules/Calculators/A&P                           Final Clinical Impression(s) / ED Diagnoses Final diagnoses:  Anaphylaxis, initial encounter    Rx / DC Orders ED Discharge Orders          Ordered    EPINEPHrine 0.3 mg/0.3 mL IJ SOAJ injection  As needed        12/22/20 1817             Tacy Learn, PA-C 12/22/20 1854    Lacretia Leigh, MD 12/23/20 [id number]

## 2020-12-22 NOTE — ED Triage Notes (Signed)
Pt BIB [facility] for eval of allergic reaction to insect bites. Pt was bitten by fire ants and shortly thereafter started w/ severe itching in armpits and complaints of possible tongue swelling. Pt attempted to drive self to hospital, felt dizzy, diaphoretic and presyncopal. Rec'd 0.3 IM Epi x2 by EMS, hypotensive for EMS. EMS reports tongue swelling w/ thick speech, therefore second dose of epi. 50 IM Benadryl.

## 2021-06-03 NOTE — Progress Notes (Signed)
HPI: FU palpitations, chest pain and bradycardia.  Coronary CTA May 2021 showed calcium score 976 which was 76 percentile, moderate stenosis in the proximal LAD; 4 mm right middle lobe pulmonary nodule.  FFR at that time not consistent with significant stenosis.  Note follow-up noncontrast chest CT was recommended.  Most recent LDL November 2022 77.  Since last seen he denies dyspnea, chest pain or syncope.  He occasionally feels his heart pound when he is working vigorously.  Current Outpatient Medications  Medication Sig Dispense Refill   amLODipine (NORVASC) 10 MG tablet Take 10 mg by mouth daily with breakfast.      EPINEPHrine 0.3 mg/0.3 mL IJ SOAJ injection Inject 0.3 mg into the muscle as needed for anaphylaxis. 2 each 0   glucosamine-chondroitin 500-400 MG tablet Take 2 tablets by mouth daily.     hydrochlorothiazide (HYDRODIURIL) 25 MG tablet Take 1 tablet by mouth daily.     meloxicam (MOBIC) 15 MG tablet Take 15 mg by mouth daily with breakfast.      montelukast (SINGULAIR) 10 MG tablet Take 10 mg by mouth at bedtime.     Multiple Vitamin (MULTIVITAMIN) tablet Take 1 tablet by mouth daily. One a day men's vitamin     olmesartan (BENICAR) 40 MG tablet Take 1 tablet by mouth daily.     omeprazole (PRILOSEC) 40 MG capsule Take 40 mg by mouth daily as needed.     rosuvastatin (CRESTOR) 5 MG tablet Take by mouth.     No current facility-administered medications for this visit.     Past Medical History:  Diagnosis Date   Anxiety    Hyperlipidemia    Hypertension 1980's   Prostate cancer ([place])    Skin cancer     Past Surgical History:  Procedure Laterality Date   CARDIAC CATHETERIZATION     COLONOSCOPY     FINGER SURGERY  1970's   POLYPECTOMY      Social History   Socioeconomic History   Marital status: Married    Spouse name: Not on file   Number of children: 1   Years of education: Not on file   Highest education level: Not on file  Occupational History    Not on file  Tobacco Use   Smoking status: Never   Smokeless tobacco: Never  Vaping Use   Vaping Use: Never used  Substance and Sexual Activity   Alcohol use: No    Alcohol/week: 0.0 standard drinks   Drug use: No   Sexual activity: Yes  Other Topics Concern   Not on file  Social History Narrative   Not on file   Social Determinants of Health   Financial Resource Strain: Not on file  Food Insecurity: Not on file  Transportation Needs: Not on file  Physical Activity: Not on file  Stress: Not on file  Social Connections: Not on file  Intimate Partner Violence: Not on file    Family History  Problem Relation Age of Onset   Heart disease Mother    Heart disease Father    Colon cancer Neg Hx    Colon polyps Neg Hx    Rectal cancer Neg Hx    Stomach cancer Neg Hx    Prostate cancer Neg Hx    Breast cancer Neg Hx     ROS: no fevers or chills, productive cough, hemoptysis, dysphasia, odynophagia, melena, hematochezia, dysuria, hematuria, rash, seizure activity, orthopnea, PND, pedal edema, claudication. Remaining systems are negative.  Physical  Exam: Well-developed well-nourished in no acute distress.  Skin is warm and dry.  HEENT is normal.  Neck is supple.  Chest is clear to auscultation with normal expansion.  Cardiovascular exam is regular rate and rhythm.  Abdominal exam nontender or distended. No masses palpated. Extremities show no edema. neuro grossly intact  ECG-normal sinus rhythm at a rate of 71, left axis deviation, left ventricular hypertrophy, cannot rule out septal infarct.  Personally reviewed  A/P  1 coronary artery disease-no chest pain.  Continue aspirin and statin.  2 hypertension-blood pressure controlled.  Continue present medical regimen.  3 hyperlipidemia-patient had some elevation of liver functions in the past.  Most recent liver functions normal.  Increase Crestor to 20 mg daily to see if he tolerates.  Check lipids and liver in 8  weeks.  4 pulmonary nodule-we will arrange follow-up noncontrast chest CT.  5 palpitations-occasional palpitations with vigorous activities.  If this becomes more bothersome we can schedule a monitor to further assess.  Kirk Ruths, MD

## 2021-06-17 ENCOUNTER — Ambulatory Visit: Payer: Medicare Other | Admitting: Cardiology

## 2021-06-17 ENCOUNTER — Encounter: Payer: Self-pay | Admitting: Cardiology

## 2021-06-17 VITALS — BP 132/64 | HR 71 | Ht 68.0 in | Wt 181.2 lb

## 2021-06-17 DIAGNOSIS — R002 Palpitations: Secondary | ICD-10-CM | POA: Diagnosis not present

## 2021-06-17 DIAGNOSIS — I10 Essential (primary) hypertension: Secondary | ICD-10-CM | POA: Diagnosis not present

## 2021-06-17 DIAGNOSIS — I2510 Atherosclerotic heart disease of native coronary artery without angina pectoris: Secondary | ICD-10-CM

## 2021-06-17 DIAGNOSIS — E7800 Pure hypercholesterolemia, unspecified: Secondary | ICD-10-CM | POA: Diagnosis not present

## 2021-06-17 DIAGNOSIS — R918 Other nonspecific abnormal finding of lung field: Secondary | ICD-10-CM

## 2021-06-17 MED ORDER — ROSUVASTATIN CALCIUM 20 MG PO TABS: 20.0000 mg | ORAL_TABLET | Freq: Every day | ORAL | 3 refills | Status: AC

## 2021-06-17 NOTE — Patient Instructions (Signed)
Medication Instructions:   INCREASE ROSUVASTATIN TO 20 MG ONCE DAILY= 4 OF THE 5 MG TABLETS ONCE DAILY  *If you need a refill on your cardiac medications before your next appointment, please call your pharmacy*   Lab Work:  Your physician recommends that you return for lab work in: [place]   If you have labs (blood work) drawn today and your tests are completely normal, you will receive your results only by: [place] (if you have MyChart) OR A paper copy in the mail If you have any lab test that is abnormal or we need to change your treatment, we will call you to review the results.   Testing/Procedures:  CT SCAN OF THE CHEST WO CONTRAST TO FOLLOW UP ON NODULE AT [address] [place]: At [facility], you and your health needs are our priority.  As part of our continuing mission to provide you with exceptional heart care, we have created designated Provider Care Teams.  These Care Teams include your primary Cardiologist (physician) and Advanced Practice Providers (APPs -  Physician Assistants and Nurse Practitioners) who all work together to provide you with the care you need, when you need it.  We recommend signing up for the patient portal called "MyChart".  Sign up information is provided on this After Visit Summary.  MyChart is used to connect with patients for Virtual Visits (Telemedicine).  Patients are able to view lab/test results, encounter notes, upcoming appointments, etc.  Non-urgent messages can be sent to your provider as well.   To learn more about what you can do with MyChart, go to [email or web address].    Your next appointment:   12 month(s)  The format for your next appointment:   In Person  Provider:   Kirk Ruths MD      Important Information About Sugar

## 2021-07-13 ENCOUNTER — Ambulatory Visit
Admission: RE | Admit: 2021-07-13 | Discharge: 2021-07-13 | Disposition: A | Discharge status: Home / Self Care | Payer: Medicare Other | Source: Ambulatory Visit | Attending: Cardiology | Admitting: Cardiology

## 2021-07-13 DIAGNOSIS — R918 Other nonspecific abnormal finding of lung field: Secondary | ICD-10-CM

## 2021-07-14 ENCOUNTER — Encounter: Payer: Self-pay | Admitting: *Deleted

## 2021-09-16 ENCOUNTER — Encounter: Payer: Self-pay | Admitting: *Deleted

## 2021-09-24 LAB — LIPID PANEL
Chol/HDL Ratio: 4.4 ratio (ref 0.0–5.0)
Cholesterol, Total: 158 mg/dL (ref 100–199)
HDL: 36 mg/dL — ABNORMAL LOW (ref >39)
LDL Chol Calc (NIH): 81 mg/dL (ref 0–99)
Triglycerides: 244 mg/dL — ABNORMAL HIGH (ref 0–149)
VLDL Cholesterol Cal: 41 mg/dL — ABNORMAL HIGH (ref 5–40)

## 2021-09-24 LAB — HEPATIC FUNCTION PANEL
ALT: 13 IU/L (ref 0–44)
AST: 14 IU/L (ref 0–40)
Albumin: 4.8 g/dL (ref 3.8–4.8)
Alkaline Phosphatase: 79 IU/L (ref 44–121)
Bilirubin Total: 1 mg/dL (ref 0.0–1.2)
Bilirubin, Direct: 0.24 mg/dL (ref 0.00–0.40)
Total Protein: 6.5 g/dL (ref 6.0–8.5)

## 2021-09-25 ENCOUNTER — Other Ambulatory Visit: Payer: Self-pay | Admitting: *Deleted

## 2021-09-25 DIAGNOSIS — I2510 Atherosclerotic heart disease of native coronary artery without angina pectoris: Secondary | ICD-10-CM

## 2021-09-25 DIAGNOSIS — E7800 Pure hypercholesterolemia, unspecified: Secondary | ICD-10-CM

## 2021-09-25 MED ORDER — EZETIMIBE 10 MG PO TABS: 10.0000 mg | ORAL_TABLET | Freq: Every day | ORAL | 11 refills | Status: AC

## 2022-11-26 IMAGING — CT CT CHEST W/O CM
1 of 2 series · 15 of 32 positions shown, 19 images · non-contrast
Comparison: CT June 12, 2019

CLINICAL DATA: Follow-up lung nodule.  History of prostate cancer.



[Series 6: super d · axial · 0.82mm/px · z∈[+593,+921]mm · 15 of 461 slices shown, 19 images]
[im 25/461  mediastinal]
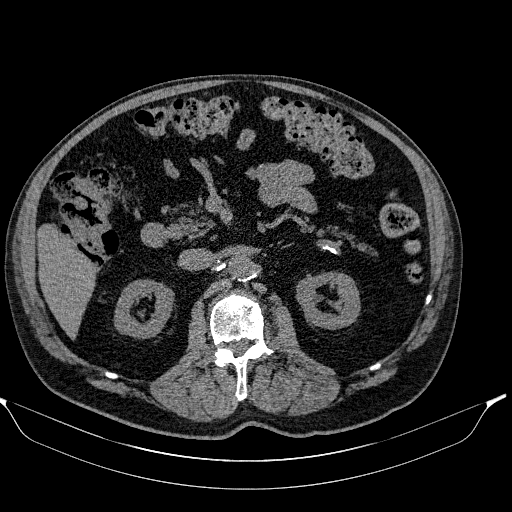
[im 25/461  lung]
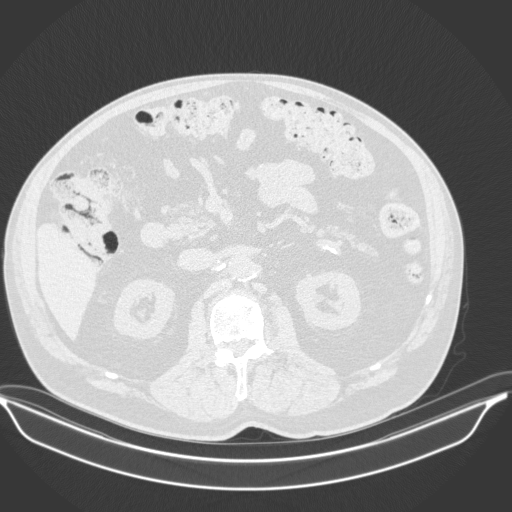
[im 73/461  lung]
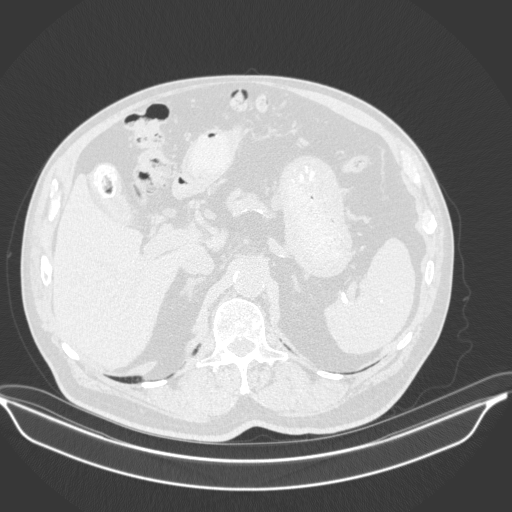
[im 97/461  lung]
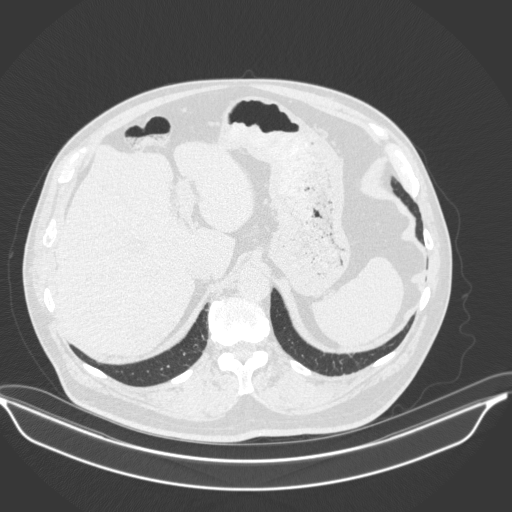
[im 122/461  lung]
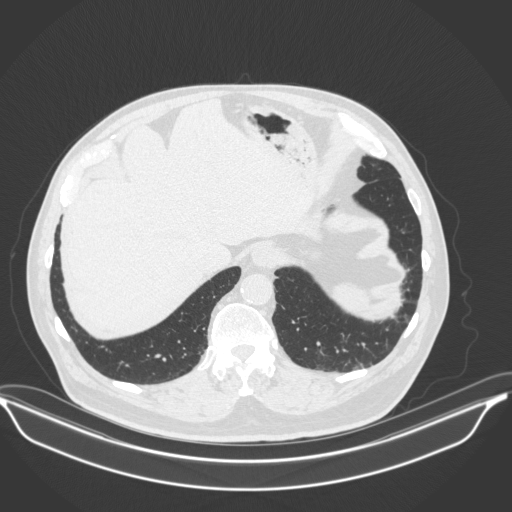
[im 154/461  mediastinal]
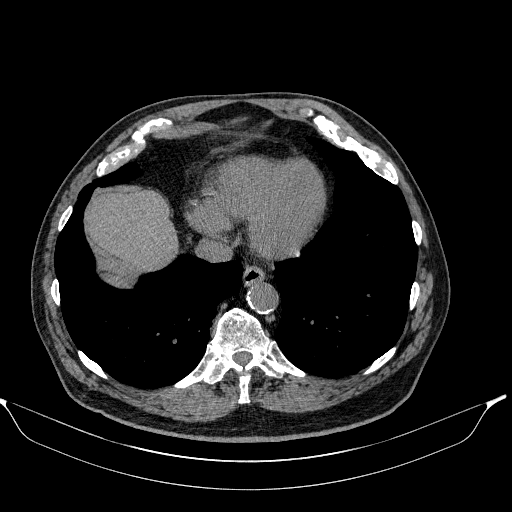
[im 154/461  lung]
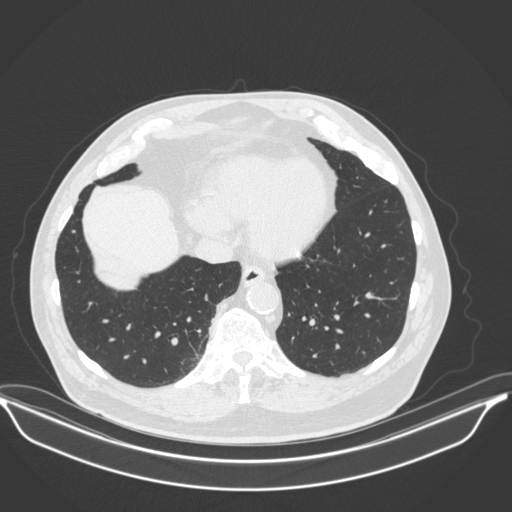
[im 170/461  lung]
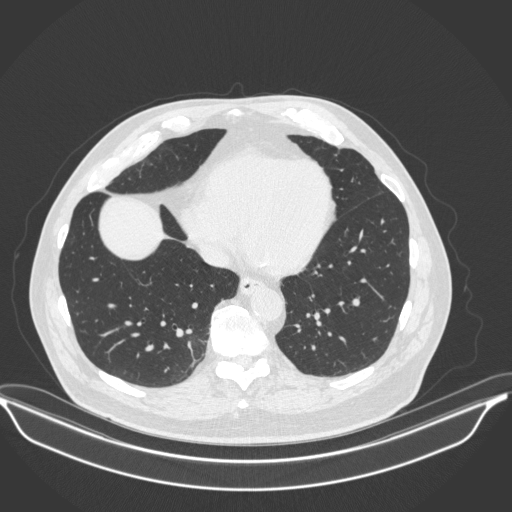
[im 218/461  lung]
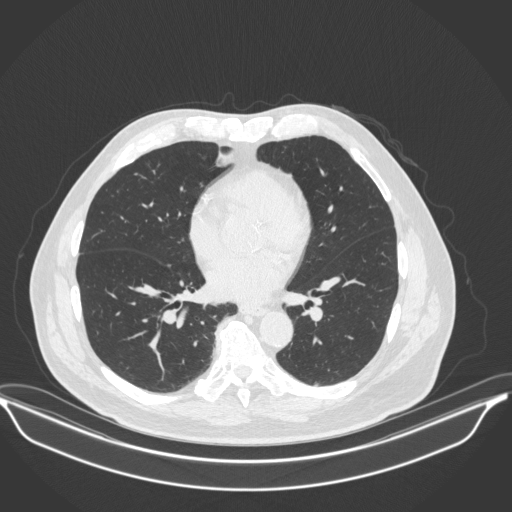
[im 219/461  lung]
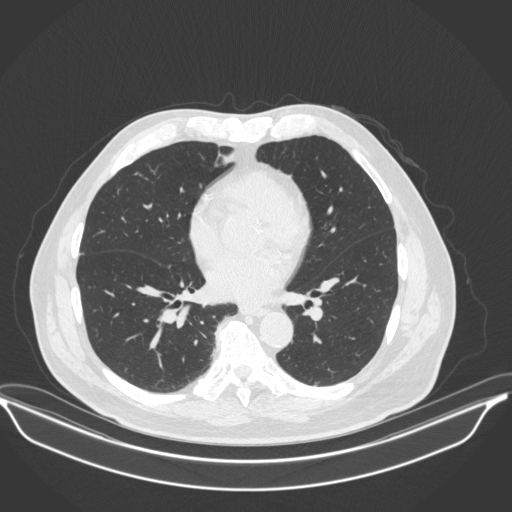
[im 243/461  mediastinal]
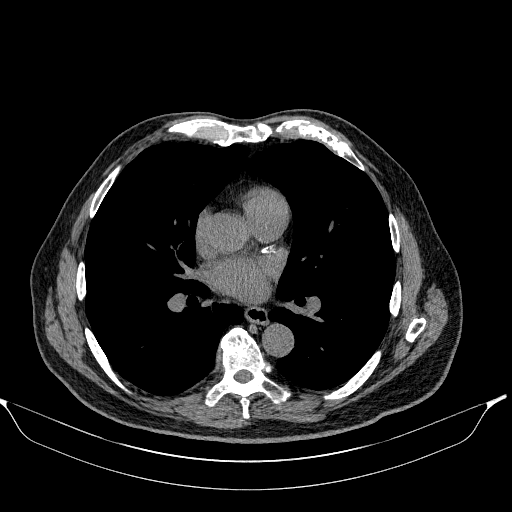
[im 243/461  lung]
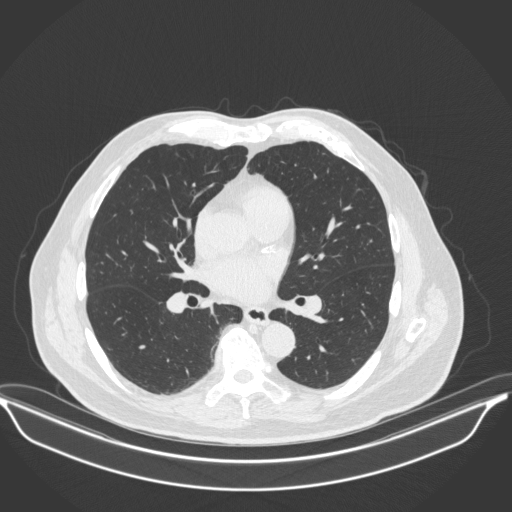
[im 291/461  lung]
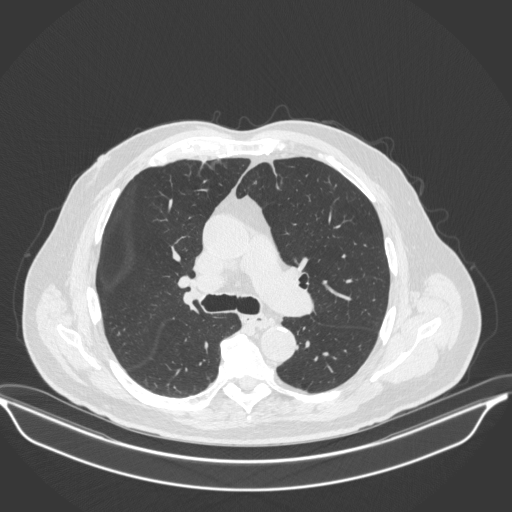
[im 307/461  lung]
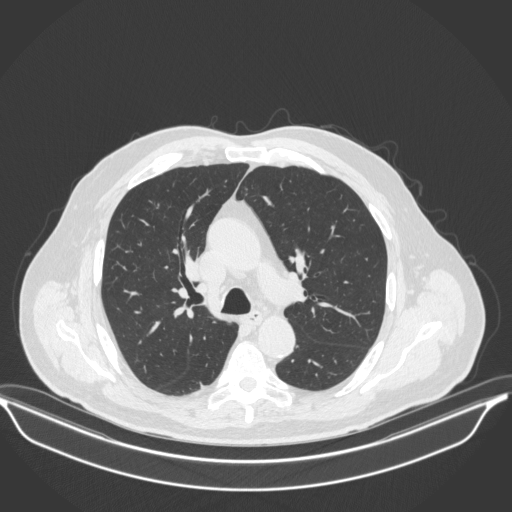
[im 339/461  lung]
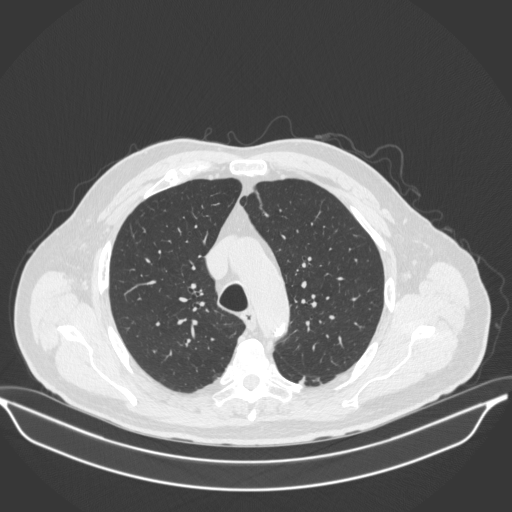
[im 364/461  mediastinal]
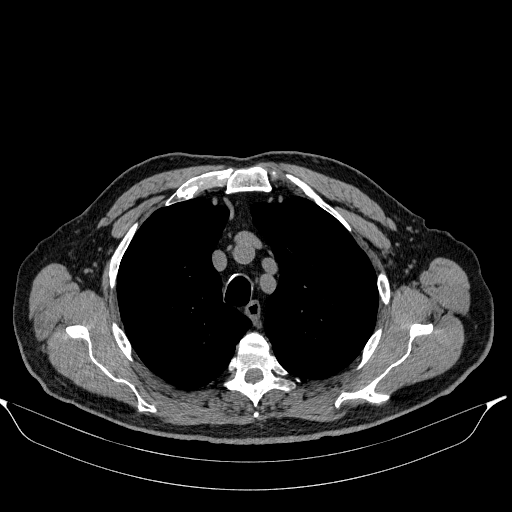
[im 364/461  lung]
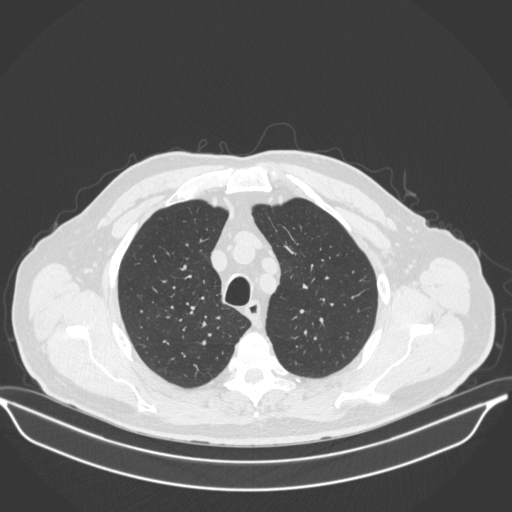
[im 388/461  lung]
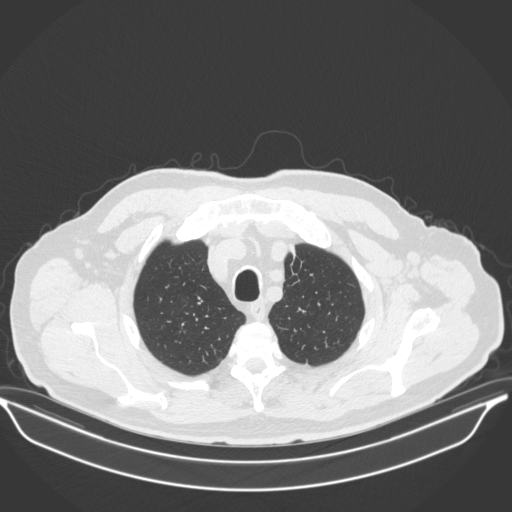
[im 436/461  lung]
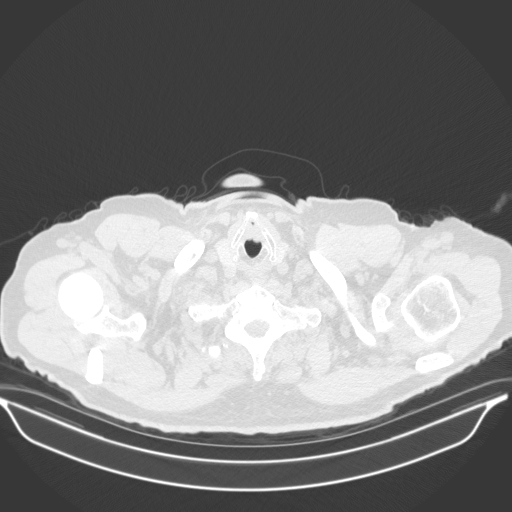

[15 of 32 positions shown; findings below may reference images not displayed]

FINDINGS: Cardiovascular: Aortic atherosclerosis. Coronary artery
calcifications. Normal size heart. No significant pericardial
effusion/thickening.

Mediastinum/Nodes: No suspicious thyroid nodule. No pathologically
enlarged mediastinal, hilar or axillary lymph nodes, noting limited
sensitivity for the detection of hilar adenopathy on this
noncontrast study. The esophagus is grossly unremarkable.

Lungs/Pleura: Solid 4 mm right middle lobe pulmonary nodule is
unchanged dating back to at least June 12, 2019 consistent with a
benign finding. 2 mm right lower lobe pulmonary nodule on image 1
[DATE] is unchanged dating back to June 12, 2019 consistent with a
benign finding. No new suspicious pulmonary nodules or masses. No
pleural effusion. No pneumothorax.

Upper Abdomen: Bilobar hepatic cysts. Cholelithiasis without
findings of acute cholecystitis. No acute abnormality. Aortic
atherosclerosis.

Musculoskeletal: Thoracic vertebral body hemangiomas. Multilevel
degenerative changes spine with bridging anterior vertebral
osteophytes. No acute osseous abnormality.
IMPRESSION: Stable pulmonary nodules measuring up to 4 mm unchanged dating back
to June 12, 2019 consistent with a benign finding.

Aortic Atherosclerosis ([id number]).
